# Patient Record
Sex: Female | Born: 1965 | Race: White | Hispanic: Yes | Marital: Married | Smoking: Current every day smoker
Health system: Southern US, Community
[De-identification: ages and names within clinical notes are randomized; demographics above are authoritative.]

## PROBLEM LIST (undated history)

## (undated) DIAGNOSIS — I639 Cerebral infarction, unspecified: Secondary | ICD-10-CM

## (undated) DIAGNOSIS — E119 Type 2 diabetes mellitus without complications: Secondary | ICD-10-CM

## (undated) DIAGNOSIS — E079 Disorder of thyroid, unspecified: Secondary | ICD-10-CM

## (undated) HISTORY — PX: CHOLECYSTECTOMY: SHX55

## (undated) HISTORY — DX: Cerebral infarction, unspecified: I63.9

## (undated) HISTORY — DX: Type 2 diabetes mellitus without complications: E11.9

## (undated) HISTORY — PX: COLONOSCOPY: SHX174

## (undated) HISTORY — PX: ABDOMINAL HYSTERECTOMY: SHX81

## (undated) HISTORY — PX: UPPER GASTROINTESTINAL ENDOSCOPY: SHX188

---

## 2001-08-01 ENCOUNTER — Other Ambulatory Visit: Admission: RE | Admit: 2001-08-01 | Discharge: 2001-08-01 | Payer: Self-pay | Admitting: Obstetrics and Gynecology

## 2001-08-05 ENCOUNTER — Ambulatory Visit (HOSPITAL_COMMUNITY): Admission: RE | Admit: 2001-08-05 | Discharge: 2001-08-05 | Payer: Self-pay | Admitting: Obstetrics and Gynecology

## 2001-08-05 ENCOUNTER — Encounter: Payer: Self-pay | Admitting: Obstetrics and Gynecology

## 2008-07-06 ENCOUNTER — Ambulatory Visit (HOSPITAL_COMMUNITY): Admission: RE | Admit: 2008-07-06 | Discharge: 2008-07-06 | Payer: Self-pay | Admitting: Internal Medicine

## 2014-02-14 ENCOUNTER — Emergency Department (HOSPITAL_COMMUNITY): Payer: Self-pay

## 2014-02-14 ENCOUNTER — Encounter (HOSPITAL_COMMUNITY): Payer: Self-pay | Admitting: Emergency Medicine

## 2014-02-14 ENCOUNTER — Emergency Department (HOSPITAL_COMMUNITY): Payer: No Typology Code available for payment source

## 2014-02-14 ENCOUNTER — Emergency Department (HOSPITAL_COMMUNITY)
Admission: EM | Admit: 2014-02-14 | Discharge: 2014-02-14 | Disposition: A | Discharge status: Home / Self Care | Payer: Self-pay | Attending: Emergency Medicine | Admitting: Emergency Medicine

## 2014-02-14 DIAGNOSIS — R109 Unspecified abdominal pain: Secondary | ICD-10-CM | POA: Insufficient documentation

## 2014-02-14 DIAGNOSIS — S79919A Unspecified injury of unspecified hip, initial encounter: Secondary | ICD-10-CM | POA: Insufficient documentation

## 2014-02-14 DIAGNOSIS — IMO0002 Reserved for concepts with insufficient information to code with codable children: Secondary | ICD-10-CM | POA: Insufficient documentation

## 2014-02-14 DIAGNOSIS — Y9389 Activity, other specified: Secondary | ICD-10-CM | POA: Insufficient documentation

## 2014-02-14 DIAGNOSIS — Y92410 Unspecified street and highway as the place of occurrence of the external cause: Secondary | ICD-10-CM | POA: Insufficient documentation

## 2014-02-14 DIAGNOSIS — S79929A Unspecified injury of unspecified thigh, initial encounter: Secondary | ICD-10-CM

## 2014-02-14 DIAGNOSIS — T148XXA Other injury of unspecified body region, initial encounter: Secondary | ICD-10-CM | POA: Insufficient documentation

## 2014-02-14 DIAGNOSIS — Z862 Personal history of diseases of the blood and blood-forming organs and certain disorders involving the immune mechanism: Secondary | ICD-10-CM | POA: Insufficient documentation

## 2014-02-14 DIAGNOSIS — S3981XA Other specified injuries of abdomen, initial encounter: Secondary | ICD-10-CM | POA: Insufficient documentation

## 2014-02-14 DIAGNOSIS — E669 Obesity, unspecified: Secondary | ICD-10-CM | POA: Insufficient documentation

## 2014-02-14 DIAGNOSIS — Z8639 Personal history of other endocrine, nutritional and metabolic disease: Secondary | ICD-10-CM | POA: Insufficient documentation

## 2014-02-14 HISTORY — DX: Disorder of thyroid, unspecified: E07.9

## 2014-02-14 LAB — COMPREHENSIVE METABOLIC PANEL
ALT: 23 U/L (ref 0–35)
AST: 21 U/L (ref 0–37)
Albumin: 3.7 g/dL (ref 3.5–5.2)
Alkaline Phosphatase: 100 U/L (ref 39–117)
BUN: 13 mg/dL (ref 6–23)
CALCIUM: 9.3 mg/dL (ref 8.4–10.5)
CO2: 24 mEq/L (ref 19–32)
CREATININE: 0.61 mg/dL (ref 0.50–1.10)
Chloride: 100 mEq/L (ref 96–112)
GFR calc non Af Amer: >90 mL/min (ref >90)
GLUCOSE: 106 mg/dL — AB (ref 70–99)
Potassium: 3.8 mEq/L (ref 3.7–5.3)
SODIUM: 139 meq/L (ref 137–147)
Total Bilirubin: 0.2 mg/dL — ABNORMAL LOW (ref 0.3–1.2)
Total Protein: 7.9 g/dL (ref 6.0–8.3)

## 2014-02-14 LAB — CBC
HEMATOCRIT: 36.8 % (ref 36.0–46.0)
HEMOGLOBIN: 12.5 g/dL (ref 12.0–15.0)
MCH: 27.7 pg (ref 26.0–34.0)
MCHC: 34 g/dL (ref 30.0–36.0)
MCV: 81.6 fL (ref 78.0–100.0)
Platelets: 337 10*3/uL (ref 150–400)
RBC: 4.51 MIL/uL (ref 3.87–5.11)
RDW: 13.4 % (ref 11.5–15.5)
WBC: 11.3 10*3/uL — ABNORMAL HIGH (ref 4.0–10.5)

## 2014-02-14 LAB — PROTIME-INR
INR: 0.91 (ref 0.00–1.49)
PROTHROMBIN TIME: 12.1 s (ref 11.6–15.2)

## 2014-02-14 LAB — SAMPLE TO BLOOD BANK

## 2014-02-14 MED ORDER — IOHEXOL 300 MG/ML  SOLN
100.0000 mL | Freq: Once | INTRAMUSCULAR | Status: AC | PRN
  Administered 2014-02-14: 100 mL via INTRAVENOUS

## 2014-02-14 MED ORDER — ONDANSETRON HCL 4 MG/2ML IJ SOLN
4.0000 mg | Freq: Once | INTRAMUSCULAR | Status: AC
  Administered 2014-02-14: 4 mg via INTRAVENOUS
  Filled 2014-02-14: qty 2

## 2014-02-14 MED ORDER — HYDROCODONE-ACETAMINOPHEN 5-325 MG PO TABS: 1.0000 | ORAL_TABLET | ORAL | Status: DC | PRN

## 2014-02-14 MED ORDER — HYDROMORPHONE HCL PF 1 MG/ML IJ SOLN
1.0000 mg | INTRAMUSCULAR | Status: DC | PRN
  Administered 2014-02-14: 1 mg via INTRAVENOUS
  Filled 2014-02-14: qty 1

## 2014-02-14 MED ORDER — NAPROXEN 500 MG PO TABS: 500.0000 mg | ORAL_TABLET | Freq: Two times a day (BID) | ORAL | Status: DC
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
## 2015-01-31 ENCOUNTER — Ambulatory Visit: Payer: Self-pay
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
## 2017-10-02 ENCOUNTER — Ambulatory Visit: Payer: Self-pay | Attending: Oncology

## 2017-10-02 VITALS — BP 126/72 | HR 74 | Temp 97.3°F | Resp 18 | Ht 63.0 in | Wt 198.0 lb

## 2017-10-02 DIAGNOSIS — Z0000 Encounter for general adult medical examination without abnormal findings: Secondary | ICD-10-CM

## 2017-10-02 NOTE — Progress Notes (Addendum)
Subjective:     Patient ID: Bethany Vance, female   DOB: 01/01/1966, 51 y.o.   MRN: [id number]  HPI   Review of Systems     Objective:   Physical Exam  Pulmonary/Chest: Right breast exhibits no inverted nipple, no mass, no nipple discharge, no skin change and no tenderness. Left breast exhibits no inverted nipple, no mass, no nipple discharge, no skin change and no tenderness. Breasts are symmetrical.  Genitourinary: No labial fusion. There is no rash, tenderness, lesion or injury on the right labia. There is no rash, tenderness, lesion or injury on the left labia. Right adnexum displays no mass, no tenderness and no fullness. Left adnexum displays no mass, no tenderness and no fullness. No erythema, tenderness or bleeding in the vagina. No foreign body in the vagina. No signs of injury around the vagina. No vaginal discharge found.  Genitourinary Comments: Hysterectomy        Assessment:     51 year old hispanic patient presents for [facility] clinic visit. Kristeen MansMaritza Afanador interprets exam.  Patient screened, and meets [facility] eligibility.  Patient does not have insurance, Medicare or Medicaid.  Handout given on Affordable Care Act. Instructed patient on breast self-exam using teach back method.  CBE unremarkable.  No mass or lump palpated.  Pelvic exam normal.  Patient reports hs had hysterectomybut still has ovaries.  [place]  She is having perimenopausal symptoms of hot flashes, vaginal dryness, and intermittent breast tenderness.  Discussed use of vaginal moisterizers, and lubricants.      Plan:     Sent for bilateral screening mammogram.

## 2017-12-25 ENCOUNTER — Ambulatory Visit
Admission: RE | Admit: 2017-12-25 | Discharge: 2017-12-25 | Disposition: A | Discharge status: Home / Self Care | Payer: Self-pay | Source: Ambulatory Visit | Attending: Oncology | Admitting: Oncology

## 2017-12-25 DIAGNOSIS — Z0000 Encounter for general adult medical examination without abnormal findings: Secondary | ICD-10-CM

## 2017-12-31 ENCOUNTER — Inpatient Hospital Stay
Admission: RE | Admit: 2017-12-31 | Discharge: 2017-12-31 | Disposition: A | Discharge status: Home / Self Care | Payer: Self-pay | Source: Ambulatory Visit | Attending: *Deleted | Admitting: *Deleted

## 2017-12-31 ENCOUNTER — Other Ambulatory Visit: Payer: Self-pay | Admitting: *Deleted

## 2017-12-31 DIAGNOSIS — Z9289 Personal history of other medical treatment: Secondary | ICD-10-CM

## 2018-01-23 NOTE — Progress Notes (Signed)
Radiologist had to wait for outside films.  [facility] results.  Patient to return in one year for annual screening.  Copy to [facility].

## 2022-11-08 ENCOUNTER — Encounter: Payer: Self-pay | Admitting: Critical Care Medicine

## 2022-11-08 ENCOUNTER — Other Ambulatory Visit: Payer: Self-pay

## 2022-11-08 ENCOUNTER — Ambulatory Visit: Payer: Self-pay | Attending: Critical Care Medicine | Admitting: Critical Care Medicine

## 2022-11-08 VITALS — BP 122/80 | HR 73 | Ht 64.0 in | Wt 209.8 lb

## 2022-11-08 DIAGNOSIS — E039 Hypothyroidism, unspecified: Secondary | ICD-10-CM

## 2022-11-08 DIAGNOSIS — E1165 Type 2 diabetes mellitus with hyperglycemia: Secondary | ICD-10-CM

## 2022-11-08 DIAGNOSIS — Z1159 Encounter for screening for other viral diseases: Secondary | ICD-10-CM

## 2022-11-08 DIAGNOSIS — Z114 Encounter for screening for human immunodeficiency virus [HIV]: Secondary | ICD-10-CM

## 2022-11-08 DIAGNOSIS — Z1231 Encounter for screening mammogram for malignant neoplasm of breast: Secondary | ICD-10-CM

## 2022-11-08 DIAGNOSIS — E119 Type 2 diabetes mellitus without complications: Secondary | ICD-10-CM | POA: Insufficient documentation

## 2022-11-08 MED ORDER — ATORVASTATIN CALCIUM 10 MG PO TABS: 10.0000 mg | ORAL_TABLET | Freq: Every day | ORAL | 3 refills | Status: DC

## 2022-11-08 MED ORDER — METFORMIN HCL ER 500 MG PO TB24: 500.0000 mg | ORAL_TABLET | Freq: Two times a day (BID) | ORAL | 1 refills | Status: DC

## 2022-11-08 MED ORDER — LEVOTHYROXINE SODIUM 100 MCG PO TABS: 100.0000 ug | ORAL_TABLET | Freq: Every day | ORAL | 1 refills | Status: DC

## 2022-11-08 MED ORDER — METFORMIN HCL ER 500 MG PO TB24
500.0000 mg | ORAL_TABLET | Freq: Two times a day (BID) | ORAL | 1 refills | Status: DC
  Filled 2022-11-08: qty 60, 30d supply, fill #0

## 2022-11-08 MED ORDER — LEVOTHYROXINE SODIUM 100 MCG PO TABS
100.0000 ug | ORAL_TABLET | Freq: Every day | ORAL | 1 refills | Status: DC
  Filled 2022-11-08: qty 90, 90d supply, fill #0

## 2022-11-08 MED ORDER — ATORVASTATIN CALCIUM 10 MG PO TABS
10.0000 mg | ORAL_TABLET | Freq: Every day | ORAL | 3 refills | Status: DC
  Filled 2022-11-08: qty 90, 90d supply, fill #0

## 2022-11-08 NOTE — Patient Instructions (Addendum)
  Mammogram will be ordered through scholarship program  You will return for a Pap smear by one of our providers  You need a dental exam that she get the orange card we will be able to get you in the dentist  Refills on all medications sent to our pharmacy downstairs and you will start a cholesterol pill as well 1 daily  Complete screening labs will be obtained  Return to Dr. Joya Gaskins 3 months  Se ordenar mamografa a travs de programa de becas  Usted regresar para una prueba de Papanicolaou realizada por uno de nuestros proveedores.  Necesitas un examen dental y ella obtendr la tarjeta naranja y podremos llevarte al dentista.  Los reabastecimientos de [employer] se envan a nuestra farmacia de abajo y [place] comenzar a tomar una pastilla para el colesterol, 1 al Training and development officer.  Se obtendrn laboratorios de deteccin completos.  Regreso al Dr. Joya Gaskins 3 meses

## 2022-11-08 NOTE — Assessment & Plan Note (Addendum)
Type 2 diabetes with hyperglycemia or reassess hemoglobin A1c and lab screenings including lipid panel and we will renew metformin 500 mg twice daily We will check lipid panel and assess lipids

## 2022-11-08 NOTE — Progress Notes (Signed)
New Patient Office Visit  Subjective    Patient ID: Bethany Vance, female    DOB: 1966/06/24  Age: 57 y.o. MRN: [id number]  CC:  Chief Complaint  Patient presents with   New Patient (Initial Visit)   Medication Refill    HPI Bethany Vance presents to establish care This visit was assisted by in person interpreter Graciela for Spanish The patient presents with prior history of type 2 diabetes and hypothyroidism.  Her daughter [name] is in the room as well.  They are trying to apply for the orange card.  She previously was seen at the [facility] clinic on [address] but has not been seen in 6 months.  She has a prior history of a CVA with headache presentation in 2009 currently is asymptomatic.  She is not taking any aspirin at this time.  She needs a mammogram and Pap smear.  She also has poor dental hygiene will need a dental exam when she gets the orange card.  Diabetes runs in her family.  She does smoke 3 cigarettes daily.  She was on metformin and Synthroid previously but does not check her blood sugar regularly.     Outpatient Encounter Medications as of 11/08/2022  Medication Sig   [DISCONTINUED] atorvastatin (LIPITOR) 10 MG tablet Take 1 tablet (10 mg total) by mouth daily.   [DISCONTINUED] levothyroxine (SYNTHROID) 100 MCG tablet Take 100 mcg by mouth daily.   [DISCONTINUED] metFORMIN (GLUCOPHAGE-XR) 500 MG 24 hr tablet Take 500 mg by mouth 2 (two) times daily.   atorvastatin (LIPITOR) 10 MG tablet Take 1 tablet (10 mg total) by mouth daily.   levothyroxine (SYNTHROID) 100 MCG tablet Take 1 tablet (100 mcg total) by mouth daily.   metFORMIN (GLUCOPHAGE-XR) 500 MG 24 hr tablet Take 1 tablet (500 mg total) by mouth 2 (two) times daily.   naproxen (NAPROSYN) 500 MG tablet Take 1 tablet (500 mg total) by mouth 2 (two) times daily. (Patient not taking: Reported on 11/08/2022)   [DISCONTINUED] HYDROcodone-acetaminophen (NORCO/VICODIN) 5-325 MG per tablet Take  1-2 tablets by mouth every 4 (four) hours as needed. (Patient not taking: Reported on 11/08/2022)   [DISCONTINUED] levothyroxine (SYNTHROID) 100 MCG tablet Take 1 tablet (100 mcg total) by mouth daily.   [DISCONTINUED] metFORMIN (GLUCOPHAGE-XR) 500 MG 24 hr tablet Take 1 tablet (500 mg total) by mouth 2 (two) times daily.   No facility-administered encounter medications on file as of 11/08/2022.    Past Medical History:  Diagnosis Date   Diabetes mellitus without complication (HCC)    Stroke (HCC)    Thyroid disease     Past Surgical History:  Procedure Laterality Date   ABDOMINAL HYSTERECTOMY     CESAREAN SECTION      Family History  Problem Relation Age of Onset   Hearing loss Mother    Diabetes Father    Diabetes Maternal Grandmother    Diabetes Maternal Grandfather    Diabetes Paternal Grandmother    Diabetes Paternal Grandfather     Social History   Socioeconomic History   Marital status: Married    Spouse name: Not on file   Number of children: Not on file   Years of education: Not on file   Highest education level: Not on file  Occupational History   Not on file  Tobacco Use   Smoking status: Every Day    Types: Cigarettes   Smokeless tobacco: Not on file   Tobacco comments:    Smokes  2 cigs a day   Vaping Use   Vaping Use: Never used  Substance and Sexual Activity   Alcohol use: No   Drug use: No   Sexual activity: Yes  Other Topics Concern   Not on file  Social History Narrative   Not on file   Social Determinants of Health   Financial Resource Strain: Not on file  Food Insecurity: Not on file  Transportation Needs: Not on file  Physical Activity: Not on file  Stress: Not on file  Social Connections: Not on file  Intimate Partner Violence: Not on file    Review of Systems  Constitutional:  Negative for chills, diaphoresis, fever, malaise/fatigue and weight loss.  HENT:  Negative for congestion, hearing loss, nosebleeds, sore throat and  tinnitus.   Eyes:  Negative for blurred vision, photophobia and redness.  Respiratory:  Negative for cough, hemoptysis, sputum production, shortness of breath, wheezing and stridor.   Cardiovascular:  Negative for chest pain, palpitations, orthopnea, claudication, leg swelling and PND.  Gastrointestinal:  Negative for abdominal pain, blood in stool, constipation, diarrhea, heartburn, nausea and vomiting.  Genitourinary:  Negative for dysuria, flank pain, frequency, hematuria and urgency.  Musculoskeletal:  Negative for back pain, falls, joint pain, myalgias and neck pain.  Skin:  Negative for itching and rash.  Neurological:  Negative for dizziness, tingling, tremors, sensory change, speech change, focal weakness, seizures, loss of consciousness, weakness and headaches.  Endo/Heme/Allergies:  Negative for environmental allergies and polydipsia. Does not bruise/bleed easily.  Psychiatric/Behavioral:  Negative for depression, memory loss, substance abuse and suicidal ideas. The patient is not nervous/anxious and does not have insomnia.         Objective    BP 122/80 (BP Location: Left Arm, Patient Position: Sitting, Cuff Size: Normal)   Pulse 73   Ht 5\' 4"  (1.626 m)   Wt 209 lb 12.8 oz (95.2 kg)   SpO2 99%   BMI 36.01 kg/m   Physical Exam Vitals reviewed.  Constitutional:      Appearance: Normal appearance. She is well-developed. She is obese. She is not diaphoretic.  HENT:     Head: Normocephalic and atraumatic.     Nose: No nasal deformity, septal deviation, mucosal edema or rhinorrhea.     Right Sinus: No maxillary sinus tenderness or frontal sinus tenderness.     Left Sinus: No maxillary sinus tenderness or frontal sinus tenderness.     Mouth/Throat:     Pharynx: No oropharyngeal exudate.     Comments: Poor dentition with molar decay in both upper lower third molars Eyes:     General: No scleral icterus.    Conjunctiva/sclera: Conjunctivae normal.     Pupils: Pupils are  equal, round, and reactive to light.  Neck:     Thyroid: No thyromegaly.     Vascular: No carotid bruit or JVD.     Trachea: Trachea normal. No tracheal tenderness or tracheal deviation.  Cardiovascular:     Rate and Rhythm: Normal rate and regular rhythm.     Chest Wall: PMI is not displaced.     Pulses: Normal pulses. No decreased pulses.     Heart sounds: Normal heart sounds, S1 normal and S2 normal. Heart sounds not distant. No murmur heard.    No systolic murmur is present.     No diastolic murmur is present.     No friction rub. No gallop. No S3 or S4 sounds.  Pulmonary:     Effort: No tachypnea, accessory muscle  usage or respiratory distress.     Breath sounds: No stridor. No decreased breath sounds, wheezing, rhonchi or rales.  Chest:     Chest wall: No tenderness.  Abdominal:     General: Bowel sounds are normal. There is no distension.     Palpations: Abdomen is soft. Abdomen is not rigid.     Tenderness: There is no abdominal tenderness. There is no guarding or rebound.  Musculoskeletal:        General: Normal range of motion.     Cervical back: Normal range of motion and neck supple. No edema, erythema or rigidity. No muscular tenderness. Normal range of motion.  Lymphadenopathy:     Head:     Right side of head: No submental or submandibular adenopathy.     Left side of head: No submental or submandibular adenopathy.     Cervical: No cervical adenopathy.  Skin:    General: Skin is warm and dry.     Coloration: Skin is not pale.     Findings: No rash.     Nails: There is no clubbing.  Neurological:     Mental Status: She is alert and oriented to person, place, and time.     Sensory: No sensory deficit.  Psychiatric:        Mood and Affect: Mood normal.        Speech: Speech normal.        Behavior: Behavior normal.        Thought Content: Thought content normal.        Judgment: Judgment normal.         Assessment & Plan:   Problem List Items Addressed  This Visit       Endocrine   Type 2 diabetes mellitus ([place]) - Primary    Type 2 diabetes with hyperglycemia or reassess hemoglobin A1c and lab screenings including lipid panel and we will renew metformin 500 mg twice daily We will check lipid panel and assess lipids      Relevant Medications   atorvastatin (LIPITOR) 10 MG tablet   metFORMIN (GLUCOPHAGE-XR) 500 MG 24 hr tablet   Other Relevant Orders   Lipid panel   Comprehensive metabolic panel   CBC with Differential/Platelet   Microalbumin / creatinine urine ratio   Hypothyroidism (acquired)    History of hypothyroidism will check thyroid panel and resume levothyroxine at 100 mg daily and follow-up      Relevant Medications   levothyroxine (SYNTHROID) 100 MCG tablet   Other Visit Diagnoses     Hypothyroidism, unspecified type       Relevant Medications   levothyroxine (SYNTHROID) 100 MCG tablet   Other Relevant Orders   Thyroid Panel With TSH   Need for hepatitis C screening test       Relevant Orders   HCV Ab w Reflex to Quant PCR   Encounter for screening for HIV       Relevant Orders   HIV Antibody (routine testing w rflx)   Encounter for screening mammogram for malignant neoplasm of breast       Relevant Orders   MM DIGITAL SCREENING BILATERAL      Will get the patient in for a mammogram and also a Pap smear Return in about 3 months (around 02/07/2023) for diabetes, thyroid.   Asencion Noble, MD

## 2022-11-08 NOTE — Assessment & Plan Note (Signed)
History of hypothyroidism will check thyroid panel and resume levothyroxine at 100 mg daily and follow-up

## 2022-11-09 ENCOUNTER — Telehealth: Payer: Self-pay

## 2022-11-09 LAB — COMPREHENSIVE METABOLIC PANEL
ALT: 17 IU/L (ref 0–32)
AST: 19 IU/L (ref 0–40)
Albumin/Globulin Ratio: 1.1 — ABNORMAL LOW (ref 1.2–2.2)
Albumin: 3.9 g/dL (ref 3.8–4.9)
Alkaline Phosphatase: 127 IU/L — ABNORMAL HIGH (ref 44–121)
BUN/Creatinine Ratio: 22 (ref 9–23)
BUN: 17 mg/dL (ref 6–24)
Bilirubin Total: <0.2 mg/dL (ref 0.0–1.2)
CO2: 23 mmol/L (ref 20–29)
Calcium: 9.4 mg/dL (ref 8.7–10.2)
Chloride: 101 mmol/L (ref 96–106)
Creatinine, Ser: 0.78 mg/dL (ref 0.57–1.00)
Globulin, Total: 3.7 g/dL (ref 1.5–4.5)
Glucose: 90 mg/dL (ref 70–99)
Potassium: 3.9 mmol/L (ref 3.5–5.2)
Sodium: 136 mmol/L (ref 134–144)
Total Protein: 7.6 g/dL (ref 6.0–8.5)
eGFR: 89 mL/min/{1.73_m2} (ref >59)

## 2022-11-09 LAB — LIPID PANEL
Chol/HDL Ratio: 5 ratio — ABNORMAL HIGH (ref 0.0–4.4)
Cholesterol, Total: 244 mg/dL — ABNORMAL HIGH (ref 100–199)
HDL: 49 mg/dL (ref >39)
LDL Chol Calc (NIH): 160 mg/dL — ABNORMAL HIGH (ref 0–99)
Triglycerides: 192 mg/dL — ABNORMAL HIGH (ref 0–149)
VLDL Cholesterol Cal: 35 mg/dL (ref 5–40)

## 2022-11-09 LAB — CBC WITH DIFFERENTIAL/PLATELET
Basophils Absolute: 0.1 10*3/uL (ref 0.0–0.2)
Basos: 1 %
EOS (ABSOLUTE): 0.2 10*3/uL (ref 0.0–0.4)
Eos: 2 %
Hematocrit: 36.6 % (ref 34.0–46.6)
Hemoglobin: 11.8 g/dL (ref 11.1–15.9)
Immature Grans (Abs): 0 10*3/uL (ref 0.0–0.1)
Immature Granulocytes: 0 %
Lymphocytes Absolute: 4.1 10*3/uL — ABNORMAL HIGH (ref 0.7–3.1)
Lymphs: 39 %
MCH: 24.7 pg — ABNORMAL LOW (ref 26.6–33.0)
MCHC: 32.2 g/dL (ref 31.5–35.7)
MCV: 77 fL — ABNORMAL LOW (ref 79–97)
Monocytes Absolute: 0.5 10*3/uL (ref 0.1–0.9)
Monocytes: 5 %
Neutrophils Absolute: 5.6 10*3/uL (ref 1.4–7.0)
Neutrophils: 53 %
Platelets: 382 10*3/uL (ref 150–450)
RBC: 4.77 x10E6/uL (ref 3.77–5.28)
RDW: 14.2 % (ref 11.7–15.4)
WBC: 10.5 10*3/uL (ref 3.4–10.8)

## 2022-11-09 LAB — MICROALBUMIN / CREATININE URINE RATIO
Creatinine, Urine: 388.9 mg/dL
Microalb/Creat Ratio: 8 mg/g creat (ref 0–29)
Microalbumin, Urine: 31.9 ug/mL

## 2022-11-09 LAB — THYROID PANEL WITH TSH
Free Thyroxine Index: 0.7 — ABNORMAL LOW (ref 1.2–4.9)
T3 Uptake Ratio: 15 % — ABNORMAL LOW (ref 24–39)
T4, Total: 4.8 ug/dL (ref 4.5–12.0)
TSH: 52.7 u[IU]/mL — ABNORMAL HIGH (ref 0.450–4.500)

## 2022-11-09 LAB — HIV ANTIBODY (ROUTINE TESTING W REFLEX): HIV Screen 4th Generation wRfx: NONREACTIVE

## 2022-11-09 LAB — HCV INTERPRETATION

## 2022-11-09 LAB — HCV AB W REFLEX TO QUANT PCR: HCV Ab: NONREACTIVE

## 2022-11-09 NOTE — Telephone Encounter (Signed)
Pt was called and is aware of results, DOB was confirmed.  Interpreter id #[id number] 

## 2022-11-09 NOTE — Telephone Encounter (Signed)
-----  Message from Charlott Rakes, MD sent at 11/09/2022  8:32 AM EST ----- Inform her that her cholesterol is elevated and thyroid labs are abnormal which is not surprising as she had been out of her medications.  No changes recommended at this time but to ensure that she adheres to her medications consistently and follow-up with Dr. Joya Gaskins for repeat labs at her next visit as well as continue with a low-cholesterol diet.

## 2022-12-03 ENCOUNTER — Encounter: Payer: Self-pay | Admitting: Physician Assistant

## 2022-12-03 ENCOUNTER — Other Ambulatory Visit (HOSPITAL_COMMUNITY)
Admission: RE | Admit: 2022-12-03 | Discharge: 2022-12-03 | Disposition: A | Discharge status: Home / Self Care | Payer: Self-pay | Source: Ambulatory Visit | Attending: Physician Assistant | Admitting: Physician Assistant

## 2022-12-03 ENCOUNTER — Ambulatory Visit: Payer: Self-pay | Attending: Physician Assistant | Admitting: Physician Assistant

## 2022-12-03 ENCOUNTER — Other Ambulatory Visit: Payer: Self-pay

## 2022-12-03 VITALS — BP 109/75 | HR 76 | Wt 208.0 lb

## 2022-12-03 DIAGNOSIS — Z124 Encounter for screening for malignant neoplasm of cervix: Secondary | ICD-10-CM | POA: Insufficient documentation

## 2022-12-03 DIAGNOSIS — N9089 Other specified noninflammatory disorders of vulva and perineum: Secondary | ICD-10-CM

## 2022-12-03 DIAGNOSIS — N941 Unspecified dyspareunia: Secondary | ICD-10-CM | POA: Insufficient documentation

## 2022-12-03 DIAGNOSIS — Z789 Other specified health status: Secondary | ICD-10-CM

## 2022-12-03 DIAGNOSIS — N898 Other specified noninflammatory disorders of vagina: Secondary | ICD-10-CM | POA: Insufficient documentation

## 2022-12-03 MED ORDER — MUPIROCIN 2 % EX OINT
1.0000 | TOPICAL_OINTMENT | Freq: Two times a day (BID) | CUTANEOUS | 0 refills | Status: DC
  Filled 2022-12-03: qty 22, 11d supply, fill #0

## 2022-12-03 NOTE — Patient Instructions (Signed)
Astroglide-purple vial   Vaginitis atrfica Atrophic Vaginitis  La vaginitis atrfica se produce cuando el recubrimiento de la vagina se seca y afina. Esto es ms frecuente en las mujeres que ya no tienen el perodo menstrual (que se encuentran en la menopausia). Suele comenzar cuando la mujer tiene entre 33 y 3 aos. Cules son las causas? La causa de esta afeccin es una cada en la produccin de una hormona femenina (estrgeno) causa esta afeccin. Qu incrementa el riesgo? Es ms probable que sufra esta afeccin si: Toma ciertos medicamentos. Le han extirpado los ovarios. Recibe tratamiento para el cncer. Ha dado a luz o est amamantando. Tiene ms de 50 aos. Fuma. Cules son los signos o sntomas? Dolor al [employer]. Sensacin de presin [employer]. Sangrado [employer]. Ardor o picazn adentro de la vagina. Dolor urente al hacer pis (orinar). Secrecin de un lquido de la vagina. Algunas personas no tienen sntomas. Cmo se trata? Uso de un lubricante antes de las [employer]. Uso de un hidratante en la vagina. Uso de estrgenos en la vagina. En algunos casos, puede ser que no se necesite tratamiento. Siga estas instrucciones en su casa: Medicamentos Use todos los medicamentos solamente como se lo haya indicado el mdico. Esto incluye medicamentos para la sequedad. No use ningn medicamento a base de hierbas, excepto que el mdico lo apruebe. Instrucciones generales Hable con su mdico acerca del tratamiento. No se haga duchas vaginales. No utilice los siguientes productos con fragancia: Aerosoles. Tampones. Jabones. Si siente dolor al [employer], pruebe usar lubricantes justo antes. Comunquese con un mdico si: Le sale un lquido que no es como el normal por la vagina. Le sale mal olor de la vagina. Aparecen nuevos sntomas. Los sntomas no mejoran cuando se los trata. Sus sntomas empeoran. Resumen Esta  afeccin se produce cuando el recubrimiento de la vagina se seca y afina. Es ms frecuente en mujeres que ya no tienen el perodo menstrual. El tratamiento puede incluir el uso de medicamentos para la sequedad. Llame a un mdico si sus sntomas no mejoran. Esta informacin no tiene Marine scientist el consejo del mdico. Asegrese de hacerle al mdico cualquier pregunta que tenga. Document Revised: 05/18/2020 Document Reviewed: 05/18/2020 Elsevier Patient Education  [place].

## 2022-12-03 NOTE — Progress Notes (Signed)
Patient ID: Bethany Vance, female   DOB: Dec 03, 1965, 57 y.o.   MRN: [id number]   Bethany Vance, is a 57 y.o. female  SWN:[id number]  [id number]:[id number]  DOB - 03-17-1966  Chief Complaint  Patient presents with   Gynecologic Exam    Annual pap  Pt c/o vaginal odor, and pimples        Subjective:   Bethany Vance is a 57 y.o. female here today for pap.  She has been having tender "bumps" on and off for about 6 months.  No fever.  Not in clusters.  She has also been having intermittent vaginal discharge.  Having vaginal dryness and discomfort with sex.  No pelvic pain No problems updated.  ALLERGIES: No Known Allergies  PAST MEDICAL HISTORY: Past Medical History:  Diagnosis Date   Diabetes mellitus without complication ([place])    Stroke ([place])    Thyroid disease     MEDICATIONS AT HOME: Prior to Admission medications   Medication Sig Start Date End Date Taking? Authorizing Provider  atorvastatin (LIPITOR) 10 MG tablet Take 1 tablet (10 mg total) by mouth daily. 11/08/22  Yes Elsie Stain, MD  levothyroxine (SYNTHROID) 100 MCG tablet Take 1 tablet (100 mcg total) by mouth daily. 11/08/22  Yes Elsie Stain, MD  metFORMIN (GLUCOPHAGE-XR) 500 MG 24 hr tablet Take 1 tablet (500 mg total) by mouth 2 (two) times daily. 11/08/22  Yes Elsie Stain, MD  mupirocin ointment (BACTROBAN) 2 % Apply 1 Application topically 2 (two) times daily. 12/03/22  Yes Freeman Caldron M, PA-C  naproxen (NAPROSYN) 500 MG tablet Take 1 tablet (500 mg total) by mouth 2 (two) times daily. Patient not taking: Reported on 11/08/2022 02/14/14   Dorie Rank, MD    ROS: Neg HEENT Neg resp Neg cardiac Neg GI Neg MS Neg psych Neg neuro  Objective:   Vitals:   12/03/22 1116 12/03/22 1119  BP: 109/75 109/75  Pulse: 76 76  SpO2: 98%   Weight: 208 lb (94.3 kg)    Exam General appearance : Awake, alert, not in any distress. Speech Clear. Not toxic looking HEENT: Atraumatic and  Normocephalic Neck: Supple, no JVD. No cervical lymphadenopathy.  Chest: Good air entry bilaterally, CTAB.  No rales/rhonchi/wheezing CVS: S1 S2 regular, no murmurs.  GU:  there are a couple bumps on the pubic area that appear like folliculitis.  HSV swab taken.  Speculum inserted-some vaginal dryness and atrophy with some discomfort.  Pap and swab taken with partial view of cervix.  Bimanual unremarkable Extremities: B/L Lower Ext shows no edema, both legs are warm to touch Neurology: Awake alert, and oriented X 3, CN II-XII intact, Non focal Skin: No Rash  Data Review No results found for: "HGBA1C"  Assessment & Plan   1. Lesion of vulva Does not look typical for HSV but possible - Herpes simplex virus culture - mupirocin ointment (BACTROBAN) 2 %; Apply 1 Application topically 2 (two) times daily.  Dispense: 22 g; Refill: 0  2. Screening for cervical cancer - Cytology - PAP([place])  3. Dyspareunia in female Likely secondary to menopause/vaginal dryness.  Advised astroglide in purple vial and showed pic online - Cervicovaginal ancillary only  4. Vaginal discharge - Cervicovaginal ancillary only  5. Language barrier [employer] "Barry Brunner" interpreters used and additional time performing visit was required.     Return for aapt in april with Dr Joya Gaskins.  The patient was given clear instructions to go to ER or return to medical  center if symptoms don't improve, worsen or new problems develop. The patient verbalized understanding. The patient was told to call to get lab results if they haven't heard anything in the next week.      Freeman Caldron, PA-C [facility] and [facility] [place], [place]   12/03/2022, 12:09 PM

## 2022-12-04 LAB — CERVICOVAGINAL ANCILLARY ONLY
Bacterial Vaginitis (gardnerella): POSITIVE — AB
Candida Glabrata: NEGATIVE
Candida Vaginitis: NEGATIVE
Chlamydia: NEGATIVE
Comment: NEGATIVE
Comment: NEGATIVE
Comment: NEGATIVE
Comment: NEGATIVE
Comment: NEGATIVE
Comment: NORMAL
Neisseria Gonorrhea: NEGATIVE
Trichomonas: NEGATIVE

## 2022-12-05 ENCOUNTER — Other Ambulatory Visit: Payer: Self-pay | Admitting: Physician Assistant

## 2022-12-05 ENCOUNTER — Other Ambulatory Visit: Payer: Self-pay

## 2022-12-05 LAB — HERPES SIMPLEX VIRUS CULTURE

## 2022-12-05 MED ORDER — METRONIDAZOLE 500 MG PO TABS
500.0000 mg | ORAL_TABLET | Freq: Two times a day (BID) | ORAL | 0 refills | Status: DC
  Filled 2022-12-05: qty 14, 7d supply, fill #0

## 2022-12-06 ENCOUNTER — Other Ambulatory Visit: Payer: Self-pay

## 2022-12-06 ENCOUNTER — Ambulatory Visit: Payer: Self-pay | Attending: Critical Care Medicine

## 2022-12-06 ENCOUNTER — Telehealth: Payer: Self-pay | Admitting: Critical Care Medicine

## 2022-12-06 LAB — CYTOLOGY - PAP
Adequacy: ABSENT
Comment: NEGATIVE
Diagnosis: NEGATIVE
High risk HPV: NEGATIVE

## 2022-12-06 NOTE — Telephone Encounter (Signed)
Pt will visit tomorrow (Fri 12/07/22) a/f 9:30 AM to pick up [facility].

## 2022-12-18 ENCOUNTER — Telehealth: Payer: Self-pay

## 2022-12-18 NOTE — Telephone Encounter (Signed)
Called patient twice unable to make contact or leave vm. Information sent to nurse pool.  Interpreter id [id number]

## 2022-12-18 NOTE — Telephone Encounter (Signed)
-----   Message from Carilyn Goodpasture, RN sent at 12/07/2022  3:08 PM EST -----  ----- Message ----- From: Argentina Donovan, PA-C Sent: 12/05/2022   1:04 PM EST To: Carilyn Goodpasture, RN  Please call patient.  Vaginal swabs showed imbalance of vaginal bacteria(bacterial vaginosis)-I sent her a prescription for this.  No yeast, no chlamydia, gonorrhea, trichomonas.  Her pap smear is not back yet.  Thanks, Freeman Caldron, PA-C

## 2022-12-20 ENCOUNTER — Ambulatory Visit
Admission: RE | Admit: 2022-12-20 | Discharge: 2022-12-20 | Disposition: A | Discharge status: Home / Self Care | Payer: No Typology Code available for payment source | Source: Ambulatory Visit | Attending: Critical Care Medicine | Admitting: Critical Care Medicine

## 2022-12-20 DIAGNOSIS — Z1231 Encounter for screening mammogram for malignant neoplasm of breast: Secondary | ICD-10-CM

## 2022-12-25 NOTE — Progress Notes (Signed)
Let pt know mammogram normal recheck one year

## 2022-12-28 ENCOUNTER — Telehealth: Payer: Self-pay

## 2022-12-28 NOTE — Telephone Encounter (Signed)
-----   Message from Elsie Stain, MD sent at 12/25/2022  8:16 AM EST ----- Let pt know mammogram normal recheck one year

## 2022-12-28 NOTE — Telephone Encounter (Signed)
Called patient unable to make contact or leave voicemail, information sent to nurse pool   Interpreter [phone]

## 2023-01-03 ENCOUNTER — Ambulatory Visit: Payer: Self-pay | Admitting: *Deleted

## 2023-01-03 NOTE — Telephone Encounter (Signed)
  Chief Complaint: Vomiting Symptoms: Intermittent Vomiting, states once a day for 2-3 days, then nothing 2-3 days. States only one episode at a time "At night when I cough phlegm up, yellowish." States burning in stomach when occurs. No appetite. States last BM 2-3 days ago, vomiting started 1 month ago. Frequency: 1 month Pertinent Negatives: Patient denies dizziness, fever, headache Disposition: '[]'$ ED /'[]'$ Urgent Care (no appt availability in office) / '[]'$ Appointment(In office/virtual)/ '[]'$  [place] [place] [facility]/ '[]'$ Home Care/ '[]'$ Refused Recommended Disposition /'[]'$ [place] [facility]/ '[x]'$  Follow-up with PCP Additional Notes: No availabiity, pt has appt 02/07/23, placed on wait list. Can pt be seen sooner?  Care advise provided, verbalizes understanding.   Reason for Disposition  [1] MILD vomiting with diarrhea AND [2] present > 5 days  Answer Assessment - Initial Assessment Questions 1. VOMITING SEVERITY: "How many times have you vomited in the past 24 hours?"     - MILD:  1 - 2 times/day    - MODERATE: 3 - 5 times/day, decreased oral intake without significant weight loss or symptoms of dehydration    - SEVERE: 6 or more times/day, vomits everything or nearly everything, with significant weight loss, symptoms of dehydration      Once a day. 2. ONSET: "When did the vomiting begin?"      1 month, 2-3 days vomiting, 2-3 days not vomiting 3. FLUIDS: "What fluids or food have you vomited up today?" "Have you been able to keep any fluids down?"     Able to drink 4. ABDOMEN PAIN: "Are your having any abdomen pain?" If Yes : "How bad is it and what does it feel like?" (e.g., crampy, dull, intermittent, constant)      Heartburn 5. DIARRHEA: "Is there any diarrhea?" If Yes, ask: "How many times today?"      Constipated, LBM  2-3 days ago.  6. CONTACTS: "Is there anyone else in the family with the same symptoms?"       7. CAUSE: "What do you think is causing your vomiting?"     Phlegm,  yellowish. 8. HYDRATION STATUS: "Any signs of dehydration?" (e.g., dry mouth [not only dry lips], too weak to stand) "When did you last urinate?"     No 9. OTHER SYMPTOMS: "Do you have any other symptoms?" (e.g., fever, headache, vertigo, vomiting blood or coffee grounds, recent head injury)     no  Protocols used: Vomiting-A-AH

## 2023-01-03 NOTE — Telephone Encounter (Signed)
Interpreter # [phone]-Patient c/o coughing up phlegm for several days. Voices that she has some SOB only  when she is walking. Patient refuse UC of ED appointment given for 01/07/2023 at [facility]

## 2023-01-07 ENCOUNTER — Other Ambulatory Visit: Payer: Self-pay

## 2023-01-07 ENCOUNTER — Ambulatory Visit (INDEPENDENT_AMBULATORY_CARE_PROVIDER_SITE_OTHER): Payer: Self-pay | Admitting: Primary Care

## 2023-01-07 ENCOUNTER — Encounter (INDEPENDENT_AMBULATORY_CARE_PROVIDER_SITE_OTHER): Payer: Self-pay | Admitting: Primary Care

## 2023-01-07 VITALS — BP 121/86 | HR 79 | Resp 16 | Wt 206.2 lb

## 2023-01-07 DIAGNOSIS — R051 Acute cough: Secondary | ICD-10-CM

## 2023-01-07 DIAGNOSIS — F4321 Adjustment disorder with depressed mood: Secondary | ICD-10-CM

## 2023-01-07 DIAGNOSIS — T7840XA Allergy, unspecified, initial encounter: Secondary | ICD-10-CM

## 2023-01-07 MED ORDER — LORATADINE 10 MG PO TABS
10.0000 mg | ORAL_TABLET | Freq: Every day | ORAL | 1 refills | Status: DC
  Filled 2023-01-07: qty 90, 90d supply, fill #0

## 2023-01-07 NOTE — Progress Notes (Signed)
[place], is a 57 y.o. female  [id number]  [id number]  DOB - 1966-07-25  Chief Complaint  Patient presents with   Cough       Subjective:   Bethany Vance is a 57 y.o. Hispanic female (interpreter [place] [phone])   here today for an acute visit for cough. She has c/o vomiting everyday for the last month and coughing 1 month.She has abdominal pain and  Nausea. Patient has No headache, No chest pain, No new weakness tingling or numbness, No Cough - shortness of breath  No problems updated.  No Known Allergies  Past Medical History:  Diagnosis Date   Diabetes mellitus without complication ([place])    Stroke [facility])    Thyroid disease     Current Outpatient Medications on File Prior to Visit  Medication Sig Dispense Refill   metroNIDAZOLE (FLAGYL) 500 MG tablet Take 1 tablet (500 mg total) by mouth 2 (two) times daily. 14 tablet 0   atorvastatin (LIPITOR) 10 MG tablet Take 1 tablet (10 mg total) by mouth daily. 90 tablet 3   levothyroxine (SYNTHROID) 100 MCG tablet Take 1 tablet (100 mcg total) by mouth daily. 90 tablet 1   metFORMIN (GLUCOPHAGE-XR) 500 MG 24 hr tablet Take 1 tablet (500 mg total) by mouth 2 (two) times daily. 180 tablet 1   mupirocin ointment (BACTROBAN) 2 % Apply 1 Application topically 2 (two) times daily. 22 g 0   naproxen (NAPROSYN) 500 MG tablet Take 1 tablet (500 mg total) by mouth 2 (two) times daily. (Patient not taking: Reported on 11/08/2022) 30 tablet 0   No current facility-administered medications on file prior to visit.    Objective:   Vitals:   01/07/23 1404  BP: 121/86  Pulse: 79  Resp: 16  SpO2: 99%  Weight: 206 lb 3.2 oz (93.5 kg)    Comprehensive ROS Pertinent positive and negative noted in HPI   Exam General appearance : Awake, alert, not in any distress. Speech Clear. Not toxic looking HEENT: Atraumatic and Normocephalic, pupils equally reactive to light and accomodation, nares  bilateral red boggy  Neck: Supple, no JVD. No cervical lymphadenopathy.  Chest: Good air entry bilaterally, no added sounds  CVS: S1 S2 regular, no murmurs.  Abdomen: Bowel sounds present, Non tender and not distended with no gaurding, rigidity or rebound. Extremities: B/L Lower Ext shows no edema, both legs are warm to touch Neurology: Awake alert, and oriented X 3, Non focal Skin: No Rash  Data Review No results found for: "HGBA1C"  Assessment & Plan  Bethany Vance was seen today for cough.  Diagnoses and all orders for this visit: [name] was seen today for cough.  Diagnoses and all orders for this visit:   Allergy, initial encounter   2/2 Acute cough -     loratadine (CLARITIN) 10 MG tablet; Take 1 tablet (10 mg total) by mouth daily.  Grieving  Loss mother a couple of months ago started cry normal grieving process  LCSS       Patient have been counseled extensively about nutrition and exercise. Other issues discussed during this visit include: low cholesterol diet, weight control and daily exercise, foot care, annual eye examinations at Ophthalmology, importance of adherence with medications and regular follow-up. We also discussed long term complications of uncontrolled diabetes and hypertension.   No follow-ups on file.  The patient was given clear instructions to go to ER or return to medical center if symptoms don't improve, worsen  or new problems develop. The patient verbalized understanding. The patient was told to call to get lab results if they haven't heard anything in the next week.   This note has been created with Surveyor, quantity. Any transcriptional errors are unintentional.   Kerin Perna, NP 01/07/2023, 2:07 PM

## 2023-01-22 ENCOUNTER — Ambulatory Visit: Payer: Self-pay | Admitting: Internal Medicine

## 2023-01-22 ENCOUNTER — Telehealth (INDEPENDENT_AMBULATORY_CARE_PROVIDER_SITE_OTHER): Payer: Self-pay | Admitting: Licensed Clinical Social Worker

## 2023-01-22 NOTE — Telephone Encounter (Signed)
Interpreter [phone] --LCSWA called patient today to introduce herself and to assess patients' mental health needs. Patient did not answer the phone. LCSWA was not able to leave a message. Patient was referred by PCP for grieving.

## 2023-02-07 ENCOUNTER — Other Ambulatory Visit: Payer: Self-pay

## 2023-02-07 ENCOUNTER — Encounter: Payer: Self-pay | Admitting: Critical Care Medicine

## 2023-02-07 ENCOUNTER — Ambulatory Visit: Payer: Self-pay | Attending: Critical Care Medicine | Admitting: Critical Care Medicine

## 2023-02-07 VITALS — BP 106/72 | HR 73 | Ht 64.0 in | Wt 205.8 lb

## 2023-02-07 DIAGNOSIS — N9089 Other specified noninflammatory disorders of vulva and perineum: Secondary | ICD-10-CM | POA: Insufficient documentation

## 2023-02-07 DIAGNOSIS — E039 Hypothyroidism, unspecified: Secondary | ICD-10-CM

## 2023-02-07 DIAGNOSIS — E1165 Type 2 diabetes mellitus with hyperglycemia: Secondary | ICD-10-CM

## 2023-02-07 DIAGNOSIS — K219 Gastro-esophageal reflux disease without esophagitis: Secondary | ICD-10-CM

## 2023-02-07 DIAGNOSIS — E1169 Type 2 diabetes mellitus with other specified complication: Secondary | ICD-10-CM

## 2023-02-07 DIAGNOSIS — R1313 Dysphagia, pharyngeal phase: Secondary | ICD-10-CM

## 2023-02-07 DIAGNOSIS — E785 Hyperlipidemia, unspecified: Secondary | ICD-10-CM

## 2023-02-07 HISTORY — DX: Type 2 diabetes mellitus with other specified complication: E11.69

## 2023-02-07 LAB — POCT GLYCOSYLATED HEMOGLOBIN (HGB A1C): HbA1c, POC (controlled diabetic range): 6.1 % (ref 0.0–7.0)

## 2023-02-07 MED ORDER — ONDANSETRON HCL 4 MG PO TABS
4.0000 mg | ORAL_TABLET | Freq: Three times a day (TID) | ORAL | 0 refills | Status: DC | PRN
  Filled 2023-02-07: qty 20, 7d supply, fill #0

## 2023-02-07 MED ORDER — PANTOPRAZOLE SODIUM 40 MG PO TBEC
40.0000 mg | DELAYED_RELEASE_TABLET | Freq: Every day | ORAL | 3 refills | Status: DC
  Filled 2023-02-07: qty 30, 30d supply, fill #0

## 2023-02-07 NOTE — Progress Notes (Signed)
Patient states that she has been vomiting phlegm and bile for 3 months.  It happens before and after eating. Only the liquid is regurgitated after eating.  Peto was taken without relief.

## 2023-02-07 NOTE — Assessment & Plan Note (Signed)
Referral to gynecology

## 2023-02-07 NOTE — Progress Notes (Signed)
New Patient Office Visit  Subjective    Patient ID: Bethany Vance, female    DOB: 10/12/1966  Age: 57 y.o. MRN: [id number]  CC:  Chief Complaint  Patient presents with   Diabetes    HPI 11/08/22 Bethany Vance presents to establish care This visit was assisted by in person interpreter Graciela for Spanish The patient presents with prior history of type 2 diabetes and hypothyroidism.  Her daughter Aram BeechamCynthia is in the room as well.  They are trying to apply for the orange card.  She previously was seen at the [place] clinic on [address] but has not been seen in 6 months.  She has a prior history of a CVA with headache presentation in 2009 currently is asymptomatic.  She is not taking any aspirin at this time.  She needs a mammogram and Pap smear.  She also has poor dental hygiene will need a dental exam when she gets the orange card.  Diabetes runs in her family.  She does smoke 3 cigarettes daily.  She was on metformin and Synthroid previously but does not check her blood sugar regularly.   02/07/23 Patient seen for return follow-up visit assisted by 1 video interpreter for Spanish [phone].  The patient has multiple complaints.  She has had nausea and emesis for 3 months.  This appeared to occur when metformin was started and she experienced onset of severe reflux symptoms.  She had hypothyroidism we started her on levothyroxine this needs to be rechecked.  On arrival A1c is 6.1 and blood pressure is 106/72.  She also has a vulvar lesion which needs to be followed up by gynecology.  She does now have the orange card.  Outpatient Encounter Medications as of 02/07/2023  Medication Sig   atorvastatin (LIPITOR) 10 MG tablet Take 1 tablet (10 mg total) by mouth daily.   levothyroxine (SYNTHROID) 100 MCG tablet Take 1 tablet (100 mcg total) by mouth daily.   ondansetron (ZOFRAN) 4 MG tablet Take 1 tablet (4 mg total) by mouth every 8 (eight) hours as needed for nausea or  vomiting.   pantoprazole (PROTONIX) 40 MG tablet Take 1 tablet (40 mg total) by mouth daily.   [DISCONTINUED] loratadine (CLARITIN) 10 MG tablet Take 1 tablet (10 mg total) by mouth daily.   [DISCONTINUED] metFORMIN (GLUCOPHAGE-XR) 500 MG 24 hr tablet Take 1 tablet (500 mg total) by mouth 2 (two) times daily.   [DISCONTINUED] metroNIDAZOLE (FLAGYL) 500 MG tablet Take 1 tablet (500 mg total) by mouth 2 (two) times daily.   [DISCONTINUED] mupirocin ointment (BACTROBAN) 2 % Apply 1 Application topically 2 (two) times daily.   [DISCONTINUED] naproxen (NAPROSYN) 500 MG tablet Take 1 tablet (500 mg total) by mouth 2 (two) times daily.   No facility-administered encounter medications on file as of 02/07/2023.    Past Medical History:  Diagnosis Date   Diabetes mellitus without complication    Hyperlipidemia associated with type 2 diabetes mellitus 02/07/2023   Stroke    Thyroid disease     Past Surgical History:  Procedure Laterality Date   ABDOMINAL HYSTERECTOMY     CESAREAN SECTION      Family History  Problem Relation Age of Onset   Hearing loss Mother    Diabetes Father    Diabetes Maternal Grandmother    Diabetes Maternal Grandfather    Diabetes Paternal Grandmother    Diabetes Paternal Grandfather     Social History   Socioeconomic History  Marital status: Married    Spouse name: Not on file   Number of children: Not on file   Years of education: Not on file   Highest education level: Not on file  Occupational History   Not on file  Tobacco Use   Smoking status: Every Day    Years: 20    Types: Cigarettes   Smokeless tobacco: Never   Tobacco comments:    Smokes 2 cigs a day   Vaping Use   Vaping Use: Never used  Substance and Sexual Activity   Alcohol use: No   Drug use: No   Sexual activity: Yes  Other Topics Concern   Not on file  Social History Narrative   Not on file   Social Determinants of Health   Financial Resource Strain: Not on file  Food  Insecurity: Not on file  Transportation Needs: Not on file  Physical Activity: Not on file  Stress: Not on file  Social Connections: Not on file  Intimate Partner Violence: Not on file    Review of Systems  Constitutional:  Negative for chills, diaphoresis, fever, malaise/fatigue and weight loss.  HENT:  Negative for congestion, hearing loss, nosebleeds, sore throat and tinnitus.   Eyes:  Negative for blurred vision, photophobia and redness.  Respiratory:  Negative for cough, hemoptysis, sputum production, shortness of breath, wheezing and stridor.   Cardiovascular:  Negative for chest pain, palpitations, orthopnea, claudication, leg swelling and PND.  Gastrointestinal:  Positive for nausea and vomiting. Negative for abdominal pain, blood in stool, constipation, diarrhea and heartburn.  Genitourinary:  Negative for dysuria, flank pain, frequency, hematuria and urgency.  Musculoskeletal:  Negative for back pain, falls, joint pain, myalgias and neck pain.  Skin:  Negative for itching and rash.  Neurological:  Negative for dizziness, tingling, tremors, sensory change, speech change, focal weakness, seizures, loss of consciousness, weakness and headaches.  Endo/Heme/Allergies:  Negative for environmental allergies and polydipsia. Does not bruise/bleed easily.  Psychiatric/Behavioral:  Negative for depression, memory loss, substance abuse and suicidal ideas. The patient is not nervous/anxious and does not have insomnia.         Objective    BP 106/72   Pulse 73   Ht 5\' 4"  (1.626 m)   Wt 205 lb 12.8 oz (93.4 kg)   SpO2 98%   BMI 35.33 kg/m   Physical Exam Vitals reviewed.  Constitutional:      Appearance: Normal appearance. She is well-developed. She is obese. She is not diaphoretic.  HENT:     Head: Normocephalic and atraumatic.     Nose: No nasal deformity, septal deviation, mucosal edema or rhinorrhea.     Right Sinus: No maxillary sinus tenderness or frontal sinus tenderness.      Left Sinus: No maxillary sinus tenderness or frontal sinus tenderness.     Mouth/Throat:     Pharynx: No oropharyngeal exudate.     Comments: Poor dentition with molar decay in both upper lower third molars Eyes:     General: No scleral icterus.    Conjunctiva/sclera: Conjunctivae normal.     Pupils: Pupils are equal, round, and reactive to light.  Neck:     Thyroid: No thyromegaly.     Vascular: No carotid bruit or JVD.     Trachea: Trachea normal. No tracheal tenderness or tracheal deviation.  Cardiovascular:     Rate and Rhythm: Normal rate and regular rhythm.     Chest Wall: PMI is not displaced.     Pulses:  Normal pulses. No decreased pulses.     Heart sounds: Normal heart sounds, S1 normal and S2 normal. Heart sounds not distant. No murmur heard.    No systolic murmur is present.     No diastolic murmur is present.     No friction rub. No gallop. No S3 or S4 sounds.  Pulmonary:     Effort: No tachypnea, accessory muscle usage or respiratory distress.     Breath sounds: No stridor. No decreased breath sounds, wheezing, rhonchi or rales.  Chest:     Chest wall: No tenderness.  Abdominal:     General: Bowel sounds are normal. There is no distension.     Palpations: Abdomen is soft. Abdomen is not rigid.     Tenderness: There is no abdominal tenderness. There is no guarding or rebound.  Musculoskeletal:        General: Normal range of motion.     Cervical back: Normal range of motion and neck supple. No edema, erythema or rigidity. No muscular tenderness. Normal range of motion.  Lymphadenopathy:     Head:     Right side of head: No submental or submandibular adenopathy.     Left side of head: No submental or submandibular adenopathy.     Cervical: No cervical adenopathy.  Skin:    General: Skin is warm and dry.     Coloration: Skin is not pale.     Findings: No rash.     Nails: There is no clubbing.  Neurological:     Mental Status: She is alert and oriented to  person, place, and time.     Sensory: No sensory deficit.  Psychiatric:        Mood and Affect: Mood normal.        Speech: Speech normal.        Behavior: Behavior normal.        Thought Content: Thought content normal.        Judgment: Judgment normal.         Assessment & Plan:   Problem List Items Addressed This Visit       Respiratory   Pharyngeal dysphagia    Gastritis and dysphagia will refer to GI start pantoprazole daily      Relevant Orders   Ambulatory referral to Gastroenterology     Endocrine   Type 2 diabetes mellitus - Primary    Patient is off metformin A1c at goal we will monitor off medication suspect metformin contributed to vomiting      Relevant Orders   HgB A1c (Completed)   Hypothyroidism (acquired)    Reassess thyroid function      Relevant Orders   Thyroid Panel With TSH   Hyperlipidemia associated with type 2 diabetes mellitus    Continue atorvastatin      Relevant Orders   Lipid panel     Genitourinary   Lesion of vulva    Referral to gynecology      Relevant Orders   Ambulatory referral to Gynecology   Other Visit Diagnoses     Gastroesophageal reflux disease without esophagitis       Relevant Medications   pantoprazole (PROTONIX) 40 MG tablet   ondansetron (ZOFRAN) 4 MG tablet     30 minutes spent extra time needed for language barrier and multisystem assessments Return in about 4 months (around 06/09/2023) for hyperlipidemia, diabetes.   Shan Levans, MD

## 2023-02-07 NOTE — Assessment & Plan Note (Signed)
Reassess thyroid function ?

## 2023-02-07 NOTE — Assessment & Plan Note (Signed)
Gastritis and dysphagia will refer to GI start pantoprazole daily

## 2023-02-07 NOTE — Assessment & Plan Note (Signed)
Patient is off metformin A1c at goal we will monitor off medication suspect metformin contributed to vomiting

## 2023-02-07 NOTE — Assessment & Plan Note (Signed)
Continue atorvastatin

## 2023-02-07 NOTE — Patient Instructions (Addendum)
Referral to gynecology made to reassess your vagina area  Stop metformin Stop Claritin  Labs today include thyroid function and cholesterol panel  Start pantoprazole daily and use Zofran as needed for nausea  Referral to stomach doctor will be made   Return to Dr. Delford Field 4 months  We will call you results of lab we may need to adjust the thyroid pill  Your hemoglobin A1c is excellent at 6.1 no medications for now are needed  Remisin a ginecologa realizada para reevaluar el rea de su vagina.  Dejar de metformina Deja de claritina  Los laboratorios de hoy incluyen panel de funcin tiroidea y colesterol.  Comience con pantoprazol diariamente y use Zofran segn sea necesario para las nuseas.  Se realizar una derivacin a un mdico estomacal.  Regreso al Dr. Delford Field 4 meses  Le llamaremos los [place] del laboratorio, es posible que necesitemos ajustar la pastilla de tiroides.  Su hemoglobina A1c es excelente en 6.1 no se necesitan medicamentos por ahora

## 2023-02-08 LAB — LIPID PANEL
Chol/HDL Ratio: 4.2 ratio (ref 0.0–4.4)
Cholesterol, Total: 189 mg/dL (ref 100–199)
HDL: 45 mg/dL (ref >39)
LDL Chol Calc (NIH): 116 mg/dL — ABNORMAL HIGH (ref 0–99)
Triglycerides: 156 mg/dL — ABNORMAL HIGH (ref 0–149)
VLDL Cholesterol Cal: 28 mg/dL (ref 5–40)

## 2023-02-08 LAB — THYROID PANEL WITH TSH
Free Thyroxine Index: 3.3 (ref 1.2–4.9)
T3 Uptake Ratio: 26 % (ref 24–39)
T4, Total: 12.8 ug/dL — ABNORMAL HIGH (ref 4.5–12.0)
TSH: 3.06 u[IU]/mL (ref 0.450–4.500)

## 2023-02-08 NOTE — Progress Notes (Signed)
Let pt cholesterol still high ,stay on cholesterol pill daily, thyroid is normal no change in thyroid medication dosing

## 2023-02-12 ENCOUNTER — Telehealth: Payer: Self-pay

## 2023-02-12 NOTE — Telephone Encounter (Signed)
-----   Message from Storm Frisk, MD sent at 02/08/2023  6:49 AM EDT ----- Let pt cholesterol still high ,stay on cholesterol pill daily, thyroid is normal no change in thyroid medication dosing

## 2023-02-12 NOTE — Telephone Encounter (Signed)
Called patient unable to leave voicemail or make contact, patient called twice    Interpreter id [phone]

## 2023-02-13 ENCOUNTER — Telehealth: Payer: Self-pay | Admitting: Critical Care Medicine

## 2023-02-13 NOTE — Telephone Encounter (Signed)
Copied from CRM [id number]. Topic: General - Other >> Feb 12, 2023  2:49 PM Ja-Kwan M wrote: Reason for CRM: Pt daughter requests that pt be scheduled for an appt to get both the shingles and tetanus shots. She also would like to know the cost of the vaccinations. Cb# [phone]

## 2023-02-13 NOTE — Telephone Encounter (Signed)
Copied from CRM #[id number]. Topic: General - Other >> Feb 12, 2023  2:49 PM Ja-Kwan M wrote: Reason for CRM: Pt daughter requests that pt be scheduled for an appt to get both the shingles and tetanus shots. She also would like to know the cost of the vaccinations. Cb# [phone] 

## 2023-02-22 ENCOUNTER — Ambulatory Visit (INDEPENDENT_AMBULATORY_CARE_PROVIDER_SITE_OTHER): Payer: Self-pay | Admitting: Gastroenterology

## 2023-02-22 ENCOUNTER — Encounter: Payer: Self-pay | Admitting: Gastroenterology

## 2023-02-22 VITALS — BP 122/80 | HR 79 | Ht 64.0 in | Wt 203.0 lb

## 2023-02-22 DIAGNOSIS — D509 Iron deficiency anemia, unspecified: Secondary | ICD-10-CM

## 2023-02-22 DIAGNOSIS — R7989 Other specified abnormal findings of blood chemistry: Secondary | ICD-10-CM

## 2023-02-22 DIAGNOSIS — R634 Abnormal weight loss: Secondary | ICD-10-CM

## 2023-02-22 DIAGNOSIS — R112 Nausea with vomiting, unspecified: Secondary | ICD-10-CM

## 2023-02-22 DIAGNOSIS — R12 Heartburn: Secondary | ICD-10-CM

## 2023-02-22 NOTE — Patient Instructions (Addendum)
_______________________________________________________  If your blood pressure at your visit was 140/90 or greater, please contact your primary care physician to follow up on this.  _______________________________________________________  If you are age 57 or older, your body mass index should be between 23-30. Your Body mass index is 34.84 kg/m. If this is out of the aforementioned range listed, please consider follow up with your Primary Care Provider.  If you are age 17 or younger, your body mass index should be between 19-25. Your Body mass index is 34.84 kg/m. If this is out of the aformentioned range listed, please consider follow up with your Primary Care Provider.   ________________________________________________________  The [facility] providers would like to encourage you to use [facility] to communicate with providers for non-urgent requests or questions.  Due to long hold times on the telephone, sending your provider a message by [facility] may be a faster and more efficient way to get a response.  Please allow 48 business hours for a response.  Please remember that this is for non-urgent requests.  _______________________________________________________  Bethany Vance have been scheduled for an endoscopy. Please follow written instructions given to you at your visit today. If you use inhalers (even only as needed), please bring them with you on the day of your procedure.  Due to recent changes in healthcare laws, you may see the results of your imaging and laboratory studies on MyChart before your provider has had a chance to review them.  We understand that in some cases there may be results that are confusing or concerning to you. Not all laboratory results come back in the same time frame and the provider may be waiting for multiple results in order to interpret others.  Please give Bethany Vance 48 hours in order for your provider to thoroughly review all the results before contacting the office for  clarification of your results.   It was a pleasure to see you today!  Thank you for trusting me with your gastrointestinal care!     Si su presin arterial en su visita era 140/90 o ms, comunquese con su mdico de atencin primaria para Presenter, broadcasting.  _______________________________________________________  Si tiene 65 aos o ms, su ndice de masa corporal debe estar entre 23 y 30. Su ndice de masa corporal es de 34,84 kg/m. Si esto est fuera del rango mencionado anteriormente, considere realizar un seguimiento con su proveedor de Marine scientist.  Si tiene 64 aos o menos, su ndice de [employer] corporal debe estar entre 19 y 3. Su ndice de masa corporal es de 34,84 kg/m. Si esto est fuera del rango mencionado anteriormente, considere realizar un seguimiento con su proveedor de Marine scientist.  ________________________________________________________  [employer] de [facility] desean alentarlo a Chemical engineer MYCHART para comunicarse con los proveedores para solicitudes o preguntas que no sean urgentes. Debido a los Retail buyer de espera en el telfono, enviar un mensaje a su proveedor a travs de [employer] puede ser Neomia Dear manera ms rpida y eficiente de obtener una respuesta. Espere 48 horas hbiles para recibir [employer]. Recuerde que esto es para solicitudes no urgentes. _______________________________________________________  Bethany Vance programado una endoscopia. Siga las instrucciones escritas que se le dieron en su visita de hoy. Si [place] inhaladores (incluso solo cuando sea necesario), trigalos con usted el da de su procedimiento.  Debido a cambios recientes en las [address] de atencin mdica, es posible que vea los [place] de sus estudios de imgenes y de laboratorio en MyChart antes de que su proveedor  haya tenido la oportunidad de revisarlos. Entendemos que en algunos casos puede haber resultados que sean confusos o preocupantes para usted. No todos los  resultados de laboratorio llegan en el mismo perodo de tiempo y es posible que el proveedor est esperando varios resultados para Administrator, Civil Service. Dnos 48 horas para que su proveedor revise minuciosamente todos los resultados antes de comunicarse con la oficina para Engineer, structural.  Fue un placer verte hoy!  Gracias por confiarme tu cuidado gastrointestinal!

## 2023-02-22 NOTE — Progress Notes (Signed)
[facility] Consult Note:  History: Bethany Vance 02/22/2023  Referring provider: Storm Frisk, MD  Reason for consult/chief complaint: Dysphagia and Gastroesophageal Reflux (Pt having problems with acid reflux, t states after she eat she start to vomit )   Subjective  HPI:  57 year old referred by primary care and accompanied by her daughter and a Spanish interpreter for worsening upper GI symptoms. Since at least sometime last year, she has had frequent epigastric pain, feelings of early satiety with reflux and heartburn.  Symptoms are worsening in the last several months and accompanied by vomiting that now occurs nearly every day.  She has lost an unspecified amount of weight in the last 4 to 6 weeks as a result of the symptoms.  Intermittent dysphagia, no odynophagia.  Little or no improvement on PPI or antiemetic. This patient established care with Dr. Shan Levans 3 months ago, and was most recently seen 2 weeks ago.  At that visit, she was complaining of at least 3 months of nausea and vomiting that seem to coincide with starting metformin.  She was also diagnosed with hypothyroidism on the January visit and started on levothyroxine.  Hemoglobin A1c 6.1.  Occasional Advil use for headaches, no aspirin use.  No known family history of ulcers or stomach cancer. ROS:  Review of Systems  Constitutional:  Positive for unexpected weight change. Negative for appetite change.  HENT:  Negative for mouth sores and voice change.   Eyes:  Negative for pain and redness.  Respiratory:  Negative for cough and shortness of breath.   Cardiovascular:  Negative for chest pain and palpitations.  Genitourinary:  Negative for dysuria and hematuria.  Musculoskeletal:  Negative for arthralgias and myalgias.  Skin:  Negative for pallor and rash.  Neurological:  Negative for weakness and headaches.  Hematological:  Negative for adenopathy.     Past Medical  History: Past Medical History:  Diagnosis Date   Diabetes mellitus without complication (HCC)    Hyperlipidemia associated with type 2 diabetes mellitus (HCC) 02/07/2023   Stroke [facility])    Thyroid disease      Past Surgical History: Past Surgical History:  Procedure Laterality Date   ABDOMINAL HYSTERECTOMY     CESAREAN SECTION     She reports having had what sounds like a precancerous condition that required the hysterectomy.  No other treatments were needed afterward.  Family History: Family History  Problem Relation Age of Onset   Hearing loss Mother    Diabetes Father    Diabetes Maternal Grandmother    Diabetes Maternal Grandfather    Diabetes Paternal Grandmother    Diabetes Paternal Grandfather    Colon cancer Neg Hx    Stomach cancer Neg Hx    Esophageal varices Neg Hx     Social History: Social History   Socioeconomic History   Marital status: Married    Spouse name: Not on file   Number of children: 3   Years of education: Not on file   Highest education level: Not on file  Occupational History   Occupation: home maker  Tobacco Use   Smoking status: Every Day    Years: 20    Types: Cigarettes   Smokeless tobacco: Never   Tobacco comments:    Smokes 2 cigs a day   Building services engineer Use: Never used  Substance and Sexual Activity   Alcohol use: No   Drug use: No   Sexual activity: Yes  Other Topics  Concern   Not on file  Social History Narrative   Not on file   Social Determinants of Health   Financial Resource Strain: Not on file  Food Insecurity: Not on file  Transportation Needs: Not on file  Physical Activity: Not on file  Stress: Not on file  Social Connections: Not on file    MOST recent PCP note indicates patient was approved for the [place] financial coverage "Orange card"  Allergies: No Known Allergies  Outpatient Meds: Current Outpatient Medications  Medication Sig Dispense Refill   ondansetron (ZOFRAN) 4 MG tablet  Take 1 tablet (4 mg total) by mouth every 8 (eight) hours as needed for nausea or vomiting. 20 tablet 0   pantoprazole (PROTONIX) 40 MG tablet Take 1 tablet (40 mg total) by mouth daily. 30 tablet 3   atorvastatin (LIPITOR) 10 MG tablet Take 1 tablet (10 mg total) by mouth daily. (Patient not taking: Reported on 02/22/2023) 90 tablet 3   levothyroxine (SYNTHROID) 100 MCG tablet Take 1 tablet (100 mcg total) by mouth daily. (Patient not taking: Reported on 02/22/2023) 90 tablet 1   No current facility-administered medications for this visit.      ___________________________________________________________________ Objective   Exam:  BP 122/80   Pulse 79   Ht 5\' 4"  (1.626 m)   Wt 203 lb (92.1 kg)   BMI 34.84 kg/m  Wt Readings from Last 3 Encounters:  02/22/23 203 lb (92.1 kg)  02/07/23 205 lb 12.8 oz (93.4 kg)  01/07/23 206 lb 3.2 oz (93.5 kg)  Weight down a few pounds in the last month. Daughter and interpreter present for entire visit General: Well-appearing.  She had to take Zofran during the visit because of nausea Eyes: sclera anicteric, no redness ENT: oral mucosa moist without lesions, no cervical or supraclavicular lymphadenopathy.  No appreciable thyromegaly  CV: Regular without appreciable murmur, no JVD, no peripheral edema Resp: clear to auscultation bilaterally, normal RR and effort noted GI: soft, epigastric and (to a lesser extent) right upper quadrant tenderness, with active bowel sounds. No guarding or palpable organomegaly noted.  No bruit Skin; warm and dry, no rash or jaundice noted Neuro: awake, alert and oriented x 3. Normal gross motor function and fluent speech  Labs:     Latest Ref Rng & Units 11/08/2022    9:53 AM 02/14/2014    1:57 PM  CBC  WBC 3.4 - 10.8 x10E3/uL 10.5  11.3   Hemoglobin 11.1 - 15.9 g/dL 16.1  09.6   Hematocrit 34.0 - 46.6 % 36.6  36.8   Platelets 150 - 450 x10E3/uL 382  337       Latest Ref Rng & Units 11/08/2022    9:53 AM  02/14/2014    1:57 PM  CMP  Glucose 70 - 99 mg/dL 90  045   BUN 6 - 24 mg/dL 17  13   Creatinine 4.09 - 1.00 mg/dL 8.11  9.14   Sodium 782 - 144 mmol/L 136  139   Potassium 3.5 - 5.2 mmol/L 3.9  3.8   Chloride 96 - 106 mmol/L 101  100   CO2 20 - 29 mmol/L 23  24   Calcium 8.7 - 10.2 mg/dL 9.4  9.3   Total Protein 6.0 - 8.5 g/dL 7.6  7.9   Total Bilirubin 0.0 - 1.2 mg/dL <9.5  0.2   Alkaline Phos 44 - 121 IU/L 127  100   AST 0 - 40 IU/L 19  21   ALT  0 - 32 IU/L 17  23    Albumin 3.9  On 11/08/2022, TSH markedly elevated at 53 with a T4 of 4.8, free thyroxine index decreased at 0.7 and T3 uptake ratio decreased at 15  On 02/07/2023 (while on levothyroxine), TSH normalized to 3.06, T4 now elevated at 12.8 cc upper limit of normal 12), FTI normal at 3.3, T3 uptake normal at 26 PCP result note reviewed, no change to thyroid medication made with these results  POC Hemoglobin A1c 6.1 on 02/07/2023   Radiologic Studies: No abdominal imaging  Assessment: Encounter Diagnoses  Name Primary?   Nausea and vomiting in adult Yes   Weight loss    Heartburn    Microcytic anemia    LFTs abnormal     Worrisome symptoms since last year, escalating in recent months with upper abdominal pain, nausea vomiting weight loss.  She also has a microcytic anemia of unclear cause in relation to symptoms.  It would not be menstrual blood loss and she has had a hysterectomy. No prior colorectal cancer screening, does not seem to be describing the change in bowel habits and she denies rectal bleeding. Slightly elevated alkaline phosphatase of unclear cause and significance of symptoms.  Consider very early biliary obstruction or choledocholithiasis.  Possibly chronic cholecystitis.  Plan:  Upper endoscopy next week.  She was agreeable after thorough discussion of procedure and risks along with diagrams of the anatomy.  The benefits and risks of the planned procedure were described in detail with the patient  or (when appropriate) their health care proxy.  Risks were outlined as including, but not limited to, bleeding, infection, perforation, adverse medication reaction leading to cardiac or pulmonary decompensation, pancreatitis (if ERCP).  The limitation of incomplete mucosal visualization was also discussed.  No guarantees or warranties were given.  If EGD unrevealing, CT scan abdomen and pelvis with contrast to follow shortly afterward.  Thank you for the courtesy of this consult.  Please call me with any questions or concerns.  Charlie Pitter III  CC: Referring provider noted above

## 2023-02-28 ENCOUNTER — Ambulatory Visit (AMBULATORY_SURGERY_CENTER): Payer: Self-pay | Admitting: Gastroenterology

## 2023-02-28 ENCOUNTER — Encounter: Payer: Self-pay | Admitting: Gastroenterology

## 2023-02-28 VITALS — BP 125/83 | HR 66 | Temp 97.5°F | Resp 14 | Ht 64.0 in | Wt 203.0 lb

## 2023-02-28 DIAGNOSIS — R634 Abnormal weight loss: Secondary | ICD-10-CM

## 2023-02-28 DIAGNOSIS — K319 Disease of stomach and duodenum, unspecified: Secondary | ICD-10-CM

## 2023-02-28 DIAGNOSIS — R112 Nausea with vomiting, unspecified: Secondary | ICD-10-CM

## 2023-02-28 MED ORDER — SODIUM CHLORIDE 0.9 % IV SOLN
500.0000 mL | INTRAVENOUS | Status: DC
Start: 2023-02-28 — End: 2023-02-28

## 2023-02-28 NOTE — Patient Instructions (Addendum)
YOU HAD AN ENDOSCOPIC PROCEDURE TODAY AT THE [facility]:   Refer to the procedure report that was given to you for any specific questions about what was found during the examination.  If the procedure report does not answer your questions, please call your gastroenterologist to clarify.  If you requested that your care partner not be given the details of your procedure findings, then the procedure report has been included in a sealed envelope for you to review at your convenience later.  YOU SHOULD EXPECT: Some feelings of bloating in the abdomen. Passage of more gas than usual.  Walking can help get rid of the air that was put into your GI tract during the procedure and reduce the bloating. If you had a lower endoscopy (such as a colonoscopy or flexible sigmoidoscopy) you may notice spotting of blood in your stool or on the toilet paper. If you underwent a bowel prep for your procedure, you may not have a normal bowel movement for a few days.  Please Note:  You might notice some irritation and congestion in your nose or some drainage.  This is from the oxygen used during your procedure.  There is no need for concern and it should clear up in a day or so.  SYMPTOMS TO REPORT IMMEDIATELY:  Following upper endoscopy (EGD)  Vomiting of blood or coffee ground material  New chest pain or pain under the shoulder blades  Painful or persistently difficult swallowing  New shortness of breath  Fever of 100F or higher  Black, tarry-looking stools  For urgent or emergent issues, a gastroenterologist can be reached at any hour by calling [phone]. Do not use MyChart messaging for urgent concerns.    DIET:  We do recommend a small meal at first, but then you may proceed to your regular diet.  Drink plenty of fluids but you should avoid alcoholic beverages for 24 hours.  ACTIVITY:  You should plan to take it easy for the rest of today and you should NOT DRIVE or use heavy machinery until  tomorrow (because of the sedation medicines used during the test).    FOLLOW UP: Our staff will call the number listed on your records the next business day following your procedure.  We will call around 7:15- 8:00 am to check on you and address any questions or concerns that you may have regarding the information given to you following your procedure. If we do not reach you, we will leave a message.     If any biopsies were taken you will be contacted by phone or by letter within the next 1-3 weeks.  Please call us at [phone] if you have not heard about the biopsies in 3 weeks.    SIGNATURES/CONFIDENTIALITY: You and/or your care partner have signed paperwork which will be entered into your electronic medical record.  These signatures attest to the fact that that the information above on your After Visit Summary has been reviewed and is understood.  Full responsibility of the confidentiality of this discharge information lies with you and/or your care-partner.   USTED TUVO UN PROCEDIMIENTO ENDOSCPICO HOY EN EL [facility]:   Lea el informe del procedimiento que se le entreg para cualquier pregunta especfica sobre lo que se Dentist.  Si el informe del examen no responde a sus preguntas, por favor llame a su gastroenterlogo para aclararlo.  Si usted solicit que no se le den [employer] de lo que se Clinical cytogeneticist  en su procedimiento al acompaante que le va a cuidar, entonces el informe del procedimiento se ha incluido en un sobre sellado para que usted lo revise despus cuando le sea ms conveniente.   LO QUE PUEDE ESPERAR: Algunas sensaciones de hinchazn en el abdomen.  Puede tener ms gases de lo normal.  El caminar puede ayudarle a eliminar el aire que se le puso en el tracto gastrointestinal durante el procedimiento y reducir la hinchazn.  Si le hicieron una endoscopia inferior (como una colonoscopia o una sigmoidoscopia flexible), podra notar manchas de sangre  en las heces fecales o en el papel higinico.  Si se someti a una preparacin intestinal para su procedimiento, es posible que no tenga una evacuacin intestinal normal durante [employer].   Tenga en cuenta:  Es posible que note un poco de irritacin y congestin en la nariz o algn drenaje.  Esto es debido al oxgeno [employer] durante su procedimiento.  No hay que preocuparse y esto debe desaparecer ms o Regulatory affairs officer.   SNTOMAS PARA REPORTAR INMEDIATAMENTE:   Despus de la endoscopia superior (EGD)  Vmitos de Retail buyer o material como caf molido   Dolor en el pecho o dolor debajo de los omplatos que antes no tena   Dolor o dificultad persistente para tragar  Falta de aire que antes no tena   Fiebre de 100F o ms  Heces fecales negras y pegajosas   Para asuntos urgentes o de Associate Professor, puede comunicarse con un gastroenterlogo a cualquier hora llamando al [phone].  DIETA:  Recomendamos una comida pequea al principio, pero luego puede continuar con su dieta normal.  Tome muchos lquidos, Tax adviser las bebidas alcohlicas durante 24 horas.    ACTIVIDAD:  Debe planear tomarse las cosas con calma por el resto del da y no debe CONDUCIR ni usar maquinaria pesada Patent examiner (debido a los medicamentos de sedacin utilizados durante el examen).     SEGUIMIENTO: Nuestro personal llamar al nmero que aparece en su historial al siguiente da hbil de su procedimiento para ver cmo se siente y para responder cualquier pregunta o inquietud que pueda tener con respecto a la informacin que se le dio despus del procedimiento. Si no podemos contactarle, le dejaremos un mensaje.  Sin embargo, si se siente bien y no tiene English as a second language teacher, no es necesario que nos devuelva la llamada.  Asumiremos que ha regresado a sus actividades diarias normales sin incidentes. Si se le tomaron algunas biopsias, le contactaremos por telfono o por carta en las prximas 3 semanas.  Si no ha sabido  [employer] biopsias en el transcurso de 3 semanas, por favor llmenos al [phone].   FIRMAS/CONFIDENCIALIDAD: Usted y/o el acompaante que le cuide han firmado documentos que se ingresarn en su historial mdico electrnico.  Estas firmas atestiguan el hecho de que la informacin anterior

## 2023-02-28 NOTE — Progress Notes (Signed)
Uneventful anesthetic. Report to pacu rn. Vss on [place] O2. Care resumed by rn.

## 2023-02-28 NOTE — Op Note (Signed)
[facility] Patient Name: Bethany Vance Procedure Date: 02/28/2023 9:46 AM MRN: [id number] Endoscopist: Sherilyn Cooter L. Myrtie Neither , MD, [id number] Age: 57 Referring MD:  Date of Birth: 02/15/1966 Gender: Female Account #: [id number] Procedure:                Upper GI endoscopy Indications:              Epigastric abdominal pain, Nausea with vomiting,                            Weight loss                           Clinical details in office consult note last week. Medicines:                Monitored Anesthesia Care Procedure:                Pre-Anesthesia Assessment:                           - Prior to the procedure, a History and Physical                            was performed, and patient medications and                            allergies were reviewed. The patient's tolerance of                            previous anesthesia was also reviewed. The risks                            and benefits of the procedure and the sedation                            options and risks were discussed with the patient.                            All questions were answered, and informed consent                            was obtained. Prior Anticoagulants: The patient has                            taken no anticoagulant or antiplatelet agents. ASA                            Grade Assessment: II - A patient with mild systemic                            disease. After reviewing the risks and benefits,                            the patient was deemed in satisfactory condition to  undergo the procedure.                           After obtaining informed consent, the endoscope was                            passed under direct vision. Throughout the                            procedure, the patient's blood pressure, pulse, and                            oxygen saturations were monitored continuously. The                            GIF [id number] #[id number] was introduced  through the                            mouth, and advanced to the second part of duodenum.                            The upper GI endoscopy was accomplished without                            difficulty. The patient tolerated the procedure                            well. Scope In: Scope Out: Findings:                 The larynx was normal.                           The esophagus was normal.                           Diffuse mucosal changes with thickened edematous                            folds and lacy white discoloration were found in                            the entire examined stomach. Multiple biopsies were                            obtained on the greater curvature of the gastric                            body, on the lesser curvature of the gastric body,                            on the greater curvature of the gastric antrum and                            on the lesser curvature of the gastric antrum  with                            cold forceps for histology. (Antrum jar 1, gastric                            body jar 2)                           The cardia and gastric fundus were normal on                            retroflexion. The stomach distended well with                            insufflation.                           The examined duodenum was normal. Complications:            No immediate complications. Estimated Blood Loss:     Estimated blood loss was minimal. Impression:               - Normal larynx.                           - Normal esophagus.                           - Mucosal changes in the stomach.                           - Normal examined duodenum.                           - Multiple biopsies were obtained on the greater                            curvature of the gastric body, on the lesser                            curvature of the gastric body, on the greater                            curvature of the gastric antrum and on the lesser                             curvature of the gastric antrum. Recommendation:           - Patient has a contact number available for                            emergencies. The signs and symptoms of potential                            delayed complications were discussed with the  patient. Return to normal activities tomorrow.                            Written discharge instructions were provided to the                            patient.                           - Resume previous diet.                           - Continue present medications.                           - Await pathology results. If unrevealing, CT scan                            abdomen and pelvis. Bethany Vance L. Myrtie Neither, MD 02/28/2023 10:17:57 AM This report has been signed electronically.

## 2023-02-28 NOTE — Progress Notes (Signed)
Called to room to assist during endoscopic procedure.  Patient ID and intended procedure confirmed with present staff. Received instructions for my participation in the procedure from the performing physician.  

## 2023-02-28 NOTE — Progress Notes (Signed)
Pt's states no medical or surgical changes since previsit or office visit. 

## 2023-02-28 NOTE — Progress Notes (Signed)
No changes to clinical history since GI office visit on 02/22/23.  The patient is appropriate for an endoscopic procedure in the ambulatory setting.  - Amada Jupiter, MD

## 2023-03-01 ENCOUNTER — Telehealth: Payer: Self-pay | Admitting: *Deleted

## 2023-03-01 NOTE — Telephone Encounter (Signed)
No voicemail reached,no answer on call back

## 2023-03-06 ENCOUNTER — Other Ambulatory Visit: Payer: Self-pay

## 2023-03-06 DIAGNOSIS — R112 Nausea with vomiting, unspecified: Secondary | ICD-10-CM

## 2023-03-06 DIAGNOSIS — R634 Abnormal weight loss: Secondary | ICD-10-CM

## 2023-03-21 ENCOUNTER — Ambulatory Visit (HOSPITAL_COMMUNITY)
Admission: RE | Admit: 2023-03-21 | Discharge: 2023-03-21 | Disposition: A | Discharge status: Home / Self Care | Payer: Self-pay | Source: Ambulatory Visit | Attending: Gastroenterology | Admitting: Gastroenterology

## 2023-03-21 DIAGNOSIS — R634 Abnormal weight loss: Secondary | ICD-10-CM

## 2023-03-21 DIAGNOSIS — R112 Nausea with vomiting, unspecified: Secondary | ICD-10-CM

## 2023-03-21 MED ORDER — IOHEXOL 300 MG/ML  SOLN
100.0000 mL | Freq: Once | INTRAMUSCULAR | Status: AC | PRN
  Administered 2023-03-21: 100 mL via INTRAVENOUS

## 2023-03-29 ENCOUNTER — Other Ambulatory Visit: Payer: Self-pay

## 2023-03-29 DIAGNOSIS — R112 Nausea with vomiting, unspecified: Secondary | ICD-10-CM

## 2023-03-29 DIAGNOSIS — R634 Abnormal weight loss: Secondary | ICD-10-CM

## 2023-04-19 ENCOUNTER — Encounter (HOSPITAL_COMMUNITY)
Admission: RE | Admit: 2023-04-19 | Discharge: 2023-04-19 | Disposition: A | Discharge status: Home / Self Care | Payer: Self-pay | Source: Ambulatory Visit | Attending: Gastroenterology | Admitting: Gastroenterology

## 2023-04-19 DIAGNOSIS — R634 Abnormal weight loss: Secondary | ICD-10-CM | POA: Insufficient documentation

## 2023-04-19 DIAGNOSIS — R112 Nausea with vomiting, unspecified: Secondary | ICD-10-CM | POA: Insufficient documentation

## 2023-04-19 MED ORDER — TECHNETIUM TC 99M SULFUR COLLOID
2.2000 | Freq: Once | INTRAVENOUS | Status: AC
  Administered 2023-04-19: 2.2 via ORAL

## 2023-05-07 ENCOUNTER — Other Ambulatory Visit (HOSPITAL_BASED_OUTPATIENT_CLINIC_OR_DEPARTMENT_OTHER): Payer: Self-pay

## 2023-06-12 ENCOUNTER — Ambulatory Visit: Payer: No Typology Code available for payment source | Admitting: Critical Care Medicine

## 2023-06-12 NOTE — Progress Notes (Unsigned)
New Patient Office Visit  Subjective    Patient ID: Bethany Vance, female    DOB: 19-Nov-1965  Age: 57 y.o. MRN: [id number]  CC:  No chief complaint on file.   HPI 11/08/22 Bethany Vance presents to establish care This visit was assisted by in person interpreter Graciela for Spanish The patient presents with prior history of type 2 diabetes and hypothyroidism.  Her daughter [name] is in the room as well.  They are trying to apply for the orange card.  She previously was seen at the [facility] on [address] but has not been seen in 6 months.  She has a prior history of a CVA with headache presentation in 2009 currently is asymptomatic.  She is not taking any aspirin at this time.  She needs a mammogram and Pap smear.  She also has poor dental hygiene will need a dental exam when she gets the orange card.  Diabetes runs in her family.  She does smoke 3 cigarettes daily.  She was on metformin and Synthroid previously but does not check her blood sugar regularly.   02/07/23 Patient seen for return follow-up visit assisted by 1 video interpreter for Spanish [phone].  The patient has multiple complaints.  She has had nausea and emesis for 3 months.  This appeared to occur when metformin was started and she experienced onset of severe reflux symptoms.  She had hypothyroidism we started her on levothyroxine this needs to be rechecked.  On arrival A1c is 6.1 and blood pressure is 106/72.  She also has a vulvar lesion which needs to be followed up by gynecology.  She does now have the orange card.  8/15  Outpatient Encounter Medications as of 06/13/2023  Medication Sig   atorvastatin (LIPITOR) 10 MG tablet Take 1 tablet (10 mg total) by mouth daily. (Patient not taking: Reported on 02/22/2023)   levothyroxine (SYNTHROID) 100 MCG tablet Take 1 tablet (100 mcg total) by mouth daily. (Patient not taking: Reported on 02/22/2023)   ondansetron (ZOFRAN) 4 MG tablet Take 1  tablet (4 mg total) by mouth every 8 (eight) hours as needed for nausea or vomiting.   pantoprazole (PROTONIX) 40 MG tablet Take 1 tablet (40 mg total) by mouth daily.   No facility-administered encounter medications on file as of 06/13/2023.    Past Medical History:  Diagnosis Date   Diabetes mellitus without complication (HCC)    Hyperlipidemia associated with type 2 diabetes mellitus (HCC) 02/07/2023   Stroke (HCC)    Thyroid disease     Past Surgical History:  Procedure Laterality Date   ABDOMINAL HYSTERECTOMY     CESAREAN SECTION      Family History  Problem Relation Age of Onset   Hearing loss Mother    Diabetes Father    Diabetes Maternal Grandmother    Diabetes Maternal Grandfather    Diabetes Paternal Grandmother    Diabetes Paternal Grandfather    Colon cancer Neg Hx    Stomach cancer Neg Hx    Esophageal varices Neg Hx     Social History   Socioeconomic History   Marital status: Married    Spouse name: Not on file   Number of children: 3   Years of education: Not on file   Highest education level: Not on file  Occupational History   Occupation: home maker  Tobacco Use   Smoking status: Every Day    Types: Cigarettes   Smokeless tobacco: Never  Tobacco comments:    Smokes 2 cigs a day   Vaping Use   Vaping status: Never Used  Substance and Sexual Activity   Alcohol use: No   Drug use: No   Sexual activity: Yes  Other Topics Concern   Not on file  Social History Narrative   Not on file   Social Determinants of Health   Financial Resource Strain: Not on file  Food Insecurity: Not on file  Transportation Needs: Not on file  Physical Activity: Not on file  Stress: Not on file  Social Connections: Not on file  Intimate Partner Violence: Not on file    Review of Systems  Constitutional:  Negative for chills, diaphoresis, fever, malaise/fatigue and weight loss.  HENT:  Negative for congestion, hearing loss, nosebleeds, sore throat and  tinnitus.   Eyes:  Negative for blurred vision, photophobia and redness.  Respiratory:  Negative for cough, hemoptysis, sputum production, shortness of breath, wheezing and stridor.   Cardiovascular:  Negative for chest pain, palpitations, orthopnea, claudication, leg swelling and PND.  Gastrointestinal:  Positive for nausea and vomiting. Negative for abdominal pain, blood in stool, constipation, diarrhea and heartburn.  Genitourinary:  Negative for dysuria, flank pain, frequency, hematuria and urgency.  Musculoskeletal:  Negative for back pain, falls, joint pain, myalgias and neck pain.  Skin:  Negative for itching and rash.  Neurological:  Negative for dizziness, tingling, tremors, sensory change, speech change, focal weakness, seizures, loss of consciousness, weakness and headaches.  Endo/Heme/Allergies:  Negative for environmental allergies and polydipsia. Does not bruise/bleed easily.  Psychiatric/Behavioral:  Negative for depression, memory loss, substance abuse and suicidal ideas. The patient is not nervous/anxious and does not have insomnia.         Objective    There were no vitals taken for this visit.  Physical Exam Vitals reviewed.  Constitutional:      Appearance: Normal appearance. She is well-developed. She is obese. She is not diaphoretic.  HENT:     Head: Normocephalic and atraumatic.     Nose: No nasal deformity, septal deviation, mucosal edema or rhinorrhea.     Right Sinus: No maxillary sinus tenderness or frontal sinus tenderness.     Left Sinus: No maxillary sinus tenderness or frontal sinus tenderness.     Mouth/Throat:     Pharynx: No oropharyngeal exudate.     Comments: Poor dentition with molar decay in both upper lower third molars Eyes:     General: No scleral icterus.    Conjunctiva/sclera: Conjunctivae normal.     Pupils: Pupils are equal, round, and reactive to light.  Neck:     Thyroid: No thyromegaly.     Vascular: No carotid bruit or JVD.      Trachea: Trachea normal. No tracheal tenderness or tracheal deviation.  Cardiovascular:     Rate and Rhythm: Normal rate and regular rhythm.     Chest Wall: PMI is not displaced.     Pulses: Normal pulses. No decreased pulses.     Heart sounds: Normal heart sounds, S1 normal and S2 normal. Heart sounds not distant. No murmur heard.    No systolic murmur is present.     No diastolic murmur is present.     No friction rub. No gallop. No S3 or S4 sounds.  Pulmonary:     Effort: No tachypnea, accessory muscle usage or respiratory distress.     Breath sounds: No stridor. No decreased breath sounds, wheezing, rhonchi or rales.  Chest:  Chest wall: No tenderness.  Abdominal:     General: Bowel sounds are normal. There is no distension.     Palpations: Abdomen is soft. Abdomen is not rigid.     Tenderness: There is no abdominal tenderness. There is no guarding or rebound.  Musculoskeletal:        General: Normal range of motion.     Cervical back: Normal range of motion and neck supple. No edema, erythema or rigidity. No muscular tenderness. Normal range of motion.  Lymphadenopathy:     Head:     Right side of head: No submental or submandibular adenopathy.     Left side of head: No submental or submandibular adenopathy.     Cervical: No cervical adenopathy.  Skin:    General: Skin is warm and dry.     Coloration: Skin is not pale.     Findings: No rash.     Nails: There is no clubbing.  Neurological:     Mental Status: She is alert and oriented to person, place, and time.     Sensory: No sensory deficit.  Psychiatric:        Mood and Affect: Mood normal.        Speech: Speech normal.        Behavior: Behavior normal.        Thought Content: Thought content normal.        Judgment: Judgment normal.         Assessment & Plan:   Problem List Items Addressed This Visit   None  30 minutes spent extra time needed for language barrier and multisystem assessments No follow-ups  on file.   Shan Levans, MD

## 2023-06-13 ENCOUNTER — Ambulatory Visit: Payer: Self-pay | Attending: Critical Care Medicine | Admitting: Critical Care Medicine

## 2023-06-13 ENCOUNTER — Other Ambulatory Visit: Payer: Self-pay

## 2023-06-13 ENCOUNTER — Encounter: Payer: Self-pay | Admitting: Critical Care Medicine

## 2023-06-13 VITALS — BP 92/70 | HR 76 | Ht 64.0 in | Wt 204.4 lb

## 2023-06-13 DIAGNOSIS — R1313 Dysphagia, pharyngeal phase: Secondary | ICD-10-CM

## 2023-06-13 DIAGNOSIS — E785 Hyperlipidemia, unspecified: Secondary | ICD-10-CM

## 2023-06-13 DIAGNOSIS — R1115 Cyclical vomiting syndrome unrelated to migraine: Secondary | ICD-10-CM

## 2023-06-13 DIAGNOSIS — Z1211 Encounter for screening for malignant neoplasm of colon: Secondary | ICD-10-CM

## 2023-06-13 DIAGNOSIS — Z6835 Body mass index (BMI) 35.0-35.9, adult: Secondary | ICD-10-CM

## 2023-06-13 DIAGNOSIS — E669 Obesity, unspecified: Secondary | ICD-10-CM

## 2023-06-13 DIAGNOSIS — Z23 Encounter for immunization: Secondary | ICD-10-CM

## 2023-06-13 DIAGNOSIS — E119 Type 2 diabetes mellitus without complications: Secondary | ICD-10-CM

## 2023-06-13 DIAGNOSIS — E1169 Type 2 diabetes mellitus with other specified complication: Secondary | ICD-10-CM

## 2023-06-13 DIAGNOSIS — E039 Hypothyroidism, unspecified: Secondary | ICD-10-CM

## 2023-06-13 DIAGNOSIS — F17210 Nicotine dependence, cigarettes, uncomplicated: Secondary | ICD-10-CM

## 2023-06-13 MED ORDER — ONDANSETRON HCL 4 MG PO TABS
4.0000 mg | ORAL_TABLET | Freq: Three times a day (TID) | ORAL | 1 refills | Status: DC | PRN
  Filled 2023-06-13: qty 30, 10d supply, fill #0

## 2023-06-13 MED ORDER — PANTOPRAZOLE SODIUM 40 MG PO TBEC
40.0000 mg | DELAYED_RELEASE_TABLET | Freq: Every day | ORAL | 3 refills | Status: DC
  Filled 2023-06-13: qty 90, 90d supply, fill #0
  Filled 2023-09-09: qty 30, 30d supply, fill #1

## 2023-06-13 MED ORDER — ATORVASTATIN CALCIUM 10 MG PO TABS
10.0000 mg | ORAL_TABLET | Freq: Every day | ORAL | 3 refills | Status: DC
  Filled 2023-06-13: qty 90, 90d supply, fill #0
  Filled 2023-09-09: qty 90, 90d supply, fill #1

## 2023-06-13 MED ORDER — LEVOTHYROXINE SODIUM 100 MCG PO TABS
100.0000 ug | ORAL_TABLET | Freq: Every day | ORAL | 1 refills | Status: DC
  Filled 2023-06-13: qty 90, 90d supply, fill #0
  Filled 2023-09-09: qty 90, 90d supply, fill #1

## 2023-06-13 NOTE — Assessment & Plan Note (Signed)
Patient has had this symptom for a while but her upper endoscopy did not show esophageal stricture

## 2023-06-13 NOTE — Assessment & Plan Note (Signed)
Persistent nausea and emesis will give Zofran as needed and resume proton pump inhibitor and refer back to gastroenterology for any other suggestions

## 2023-06-13 NOTE — Assessment & Plan Note (Signed)
Resume lipid therapy

## 2023-06-13 NOTE — Assessment & Plan Note (Signed)
Patient still vomiting without metformin nevertheless will hold for now

## 2023-06-13 NOTE — Patient Instructions (Addendum)
REsume all medications Will have you go back to stomach doctor  Return Dr Delford Field 6 weeks   Reanudar todos los medicamentos Quieres que vuelvas al [facility] [place]?  Volver Dr. Delford Field 6 semanas

## 2023-06-13 NOTE — Assessment & Plan Note (Signed)
Reassess thyroid function and resume thyroid medication

## 2023-06-16 LAB — COMPREHENSIVE METABOLIC PANEL
ALT: 11 IU/L (ref 0–32)
AST: 19 IU/L (ref 0–40)
Albumin: 4.1 g/dL (ref 3.8–4.9)
Alkaline Phosphatase: 97 IU/L (ref 44–121)
BUN/Creatinine Ratio: 10 (ref 9–23)
BUN: 10 mg/dL (ref 6–24)
Bilirubin Total: 0.4 mg/dL (ref 0.0–1.2)
CO2: 25 mmol/L (ref 20–29)
Calcium: 9.4 mg/dL (ref 8.7–10.2)
Chloride: 100 mmol/L (ref 96–106)
Creatinine, Ser: 0.97 mg/dL (ref 0.57–1.00)
Globulin, Total: 2.9 g/dL (ref 1.5–4.5)
Glucose: 107 mg/dL — ABNORMAL HIGH (ref 70–99)
Potassium: 4.3 mmol/L (ref 3.5–5.2)
Sodium: 139 mmol/L (ref 134–144)
Total Protein: 7 g/dL (ref 6.0–8.5)
eGFR: 68 mL/min/{1.73_m2} (ref >59)

## 2023-06-16 LAB — THYROID PANEL WITH TSH
Free Thyroxine Index: 0.4 — ABNORMAL LOW (ref 1.2–4.9)
T3 Uptake Ratio: 14 % — ABNORMAL LOW (ref 24–39)
T4, Total: 3.2 ug/dL — ABNORMAL LOW (ref 4.5–12.0)
TSH: 120 u[IU]/mL — ABNORMAL HIGH (ref 0.450–4.500)

## 2023-06-16 LAB — CBC WITH DIFFERENTIAL/PLATELET
Basophils Absolute: 0.1 10*3/uL (ref 0.0–0.2)
Basos: 1 %
EOS (ABSOLUTE): 0.5 10*3/uL — ABNORMAL HIGH (ref 0.0–0.4)
Eos: 5 %
Hematocrit: 37.8 % (ref 34.0–46.6)
Hemoglobin: 12.5 g/dL (ref 11.1–15.9)
Immature Grans (Abs): 0.1 10*3/uL (ref 0.0–0.1)
Immature Granulocytes: 1 %
Lymphocytes Absolute: 3.8 10*3/uL — ABNORMAL HIGH (ref 0.7–3.1)
Lymphs: 36 %
MCH: 27.5 pg (ref 26.6–33.0)
MCHC: 33.1 g/dL (ref 31.5–35.7)
MCV: 83 fL (ref 79–97)
Monocytes Absolute: 0.6 10*3/uL (ref 0.1–0.9)
Monocytes: 5 %
Neutrophils Absolute: 5.5 10*3/uL (ref 1.4–7.0)
Neutrophils: 52 %
Platelets: 370 10*3/uL (ref 150–450)
RBC: 4.54 x10E6/uL (ref 3.77–5.28)
RDW: 15.5 % — ABNORMAL HIGH (ref 11.7–15.4)
WBC: 10.5 10*3/uL (ref 3.4–10.8)

## 2023-06-17 NOTE — Progress Notes (Signed)
Let pt know thyroid function was very low, likely all of patient symptoms were /are from low thyroid , take thyroid medication as prescribed Rest of labs normal

## 2023-06-19 ENCOUNTER — Telehealth: Payer: Self-pay

## 2023-06-19 NOTE — Telephone Encounter (Signed)
Pt was called and is aware of results, DOB was confirmed.  Interpreter id # [phone]

## 2023-06-19 NOTE — Telephone Encounter (Signed)
-----   Message from Shan Levans sent at 06/17/2023  6:56 AM EDT ----- Let pt know thyroid function was very low, likely all of patient symptoms were /are from low thyroid , take thyroid medication as prescribed Rest of labs normal

## 2023-07-18 ENCOUNTER — Encounter: Payer: Self-pay | Admitting: Critical Care Medicine

## 2023-07-25 ENCOUNTER — Ambulatory Visit: Payer: Self-pay | Attending: Critical Care Medicine | Admitting: Critical Care Medicine

## 2023-07-25 ENCOUNTER — Encounter: Payer: Self-pay | Admitting: Critical Care Medicine

## 2023-07-25 ENCOUNTER — Other Ambulatory Visit: Payer: Self-pay

## 2023-07-25 VITALS — BP 102/69 | HR 74 | Wt 199.8 lb

## 2023-07-25 DIAGNOSIS — E039 Hypothyroidism, unspecified: Secondary | ICD-10-CM

## 2023-07-25 DIAGNOSIS — Z7984 Long term (current) use of oral hypoglycemic drugs: Secondary | ICD-10-CM

## 2023-07-25 DIAGNOSIS — E119 Type 2 diabetes mellitus without complications: Secondary | ICD-10-CM

## 2023-07-25 DIAGNOSIS — R1115 Cyclical vomiting syndrome unrelated to migraine: Secondary | ICD-10-CM

## 2023-07-25 DIAGNOSIS — R1313 Dysphagia, pharyngeal phase: Secondary | ICD-10-CM

## 2023-07-25 NOTE — Assessment & Plan Note (Signed)
I will make another referral to gastroenterology and indicate she did not know was them calling her I gave the patient the number they will be calling from and told her to put in her contact folder and her phone  Discontinue Zofran continue pantoprazole

## 2023-07-25 NOTE — Assessment & Plan Note (Signed)
Not requiring medication

## 2023-07-25 NOTE — Progress Notes (Signed)
New Patient Office Visit  Subjective    Patient ID: Bethany Vance, female    DOB: [birth year]  Age: 57 y.o. MRN: [id number]  CC:  Chief Complaint  Patient presents with   Medication Refill    HPI 11/08/22 Bethany Vance presents to establish care This visit was assisted by in person interpreter Graciela for Spanish The patient presents with prior history of type 2 diabetes and hypothyroidism.  Her daughter [name] is in the room as well.  They are trying to apply for the orange card.  She previously was seen at the Spanish speaking clinic on [address] but has not been seen in 6 months.  She has a prior history of a CVA with headache presentation in 2009 currently is asymptomatic.  She is not taking any aspirin at this time.  She needs a mammogram and Pap smear.  She also has poor dental hygiene will need a dental exam when she gets the orange card.  Diabetes runs in her family.  She does smoke 3 cigarettes daily.  She was on metformin and Synthroid previously but does not check her blood sugar regularly.   02/07/23 Patient seen for return follow-up visit assisted by 1 video interpreter for Spanish [phone].  The patient has multiple complaints.  She has had nausea and emesis for 3 months.  This appeared to occur when metformin was started and she experienced onset of severe reflux symptoms.  She had hypothyroidism we started her on levothyroxine this needs to be rechecked.  On arrival A1c is 6.1 and blood pressure is 106/72.  She also has a vulvar lesion which needs to be followed up by gynecology.  She does now have the orange card.  8/15 The patient is seen in return follow-up and has a number of complaints visit assisted by Spanish video interpreter Marian Sorrow [phone].  Patient had nausea and vomiting in the past this has recurred for the past month.  She had a full workup for this earlier in the year which was essentially unremarkable per gastroenterology.  She was maintained on  a proton pump inhibitor which she has since stopped.  She is also been off her thyroid medication for several months.  She was under the interpretation from gastroenterology to stop all her medications while they worked her up and then the medicines were not resumed.  The overall time course of her symptoms have been as long as November of last year.  She is not smoking at this time.  Note she does not have a gallbladder it has been removed.  07/25/23 The patient was seen in return follow-up video Spanish interpreter bladimir [phone] was assisting  At the last visit in August she was having emesis nausea excess vomiting we referred to gastroenterology they tried to get her several times she would not answer the phone she tells [name] that she thought these were spam calls.  Note that when she was previously evaluated earlier this year she had gastropathy reactive but no evidence of esophageal stricture she does have difficulty swallowing yet her previous upper endoscopy was normal in the esophageal area and her gastric emptying study was normal  She states Zofran actually causes heartburn and makes her worse she is taking the pantoprazole.  Outpatient Encounter Medications as of 07/25/2023  Medication Sig   atorvastatin (LIPITOR) 10 MG tablet Take 1 tablet (10 mg total) by mouth daily.   levothyroxine (SYNTHROID) 100 MCG tablet Take 1 tablet (100 mcg total) by  mouth daily.   pantoprazole (PROTONIX) 40 MG tablet Take 1 tablet (40 mg total) by mouth daily.   [DISCONTINUED] ondansetron (ZOFRAN) 4 MG tablet Take 1 tablet (4 mg total) by mouth every 8 (eight) hours as needed for nausea or vomiting. (Patient not taking: Reported on 07/25/2023)   No facility-administered encounter medications on file as of 07/25/2023.    Past Medical History:  Diagnosis Date   Diabetes mellitus without complication (HCC)    Hyperlipidemia associated with type 2 diabetes mellitus (HCC) 02/07/2023   Stroke (HCC)    Thyroid  disease     Past Surgical History:  Procedure Laterality Date   ABDOMINAL HYSTERECTOMY     CESAREAN SECTION      Family History  Problem Relation Age of Onset   Hearing loss Mother    Diabetes Father    Diabetes Maternal Grandmother    Diabetes Maternal Grandfather    Diabetes Paternal Grandmother    Diabetes Paternal Grandfather    Colon cancer Neg Hx    Stomach cancer Neg Hx    Esophageal varices Neg Hx     Social History   Socioeconomic History   Marital status: Married    Spouse name: Not on file   Number of children: 3   Years of education: Not on file   Highest education level: Not on file  Occupational History   Occupation: home maker  Tobacco Use   Smoking status: Every Day    Types: Cigarettes   Smokeless tobacco: Never   Tobacco comments:    Smokes 2 cigs a day   Vaping Use   Vaping status: Never Used  Substance and Sexual Activity   Alcohol use: No   Drug use: No   Sexual activity: Yes  Other Topics Concern   Not on file  Social History Narrative   Not on file   Social Determinants of Health   Financial Resource Strain: Not on file  Food Insecurity: Not on file  Transportation Needs: Not on file  Physical Activity: Not on file  Stress: Not on file  Social Connections: Not on file  Intimate Partner Violence: Not on file    Review of Systems  Constitutional:  Negative for chills, diaphoresis, fever, malaise/fatigue and weight loss.  HENT:  Negative for congestion, hearing loss, nosebleeds, sore throat and tinnitus.   Eyes:  Negative for blurred vision, photophobia and redness.  Respiratory:  Negative for cough, hemoptysis, sputum production, shortness of breath, wheezing and stridor.   Cardiovascular:  Negative for chest pain, palpitations, orthopnea, claudication, leg swelling and PND.  Gastrointestinal:  Positive for nausea and vomiting. Negative for abdominal pain, blood in stool, constipation, diarrhea and heartburn.  Genitourinary:   Negative for dysuria, flank pain, frequency, hematuria and urgency.  Musculoskeletal:  Negative for back pain, falls, joint pain, myalgias and neck pain.  Skin:  Negative for itching and rash.  Neurological:  Negative for dizziness, tingling, tremors, sensory change, speech change, focal weakness, seizures, loss of consciousness, weakness and headaches.  Endo/Heme/Allergies:  Negative for environmental allergies and polydipsia. Does not bruise/bleed easily.  Psychiatric/Behavioral:  Negative for depression, memory loss, substance abuse and suicidal ideas. The patient is not nervous/anxious and does not have insomnia.         Objective    BP 102/69 (BP Location: Right Arm, Patient Position: Sitting, Cuff Size: Normal)   Pulse 74   Wt 199 lb 12.8 oz (90.6 kg)   SpO2 98%   BMI 34.30 kg/m  Physical Exam Vitals reviewed.  Constitutional:      Appearance: Normal appearance. She is well-developed. She is obese. She is not diaphoretic.  HENT:     Head: Normocephalic and atraumatic.     Nose: No nasal deformity, septal deviation, mucosal edema or rhinorrhea.     Right Sinus: No maxillary sinus tenderness or frontal sinus tenderness.     Left Sinus: No maxillary sinus tenderness or frontal sinus tenderness.     Mouth/Throat:     Pharynx: No oropharyngeal exudate.     Comments: Poor dentition with molar decay in both upper lower third molars Eyes:     General: No scleral icterus.    Conjunctiva/sclera: Conjunctivae normal.     Pupils: Pupils are equal, round, and reactive to light.  Neck:     Thyroid: No thyromegaly.     Vascular: No carotid bruit or JVD.     Trachea: Trachea normal. No tracheal tenderness or tracheal deviation.  Cardiovascular:     Rate and Rhythm: Normal rate and regular rhythm.     Chest Wall: PMI is not displaced.     Pulses: Normal pulses. No decreased pulses.     Heart sounds: Normal heart sounds, S1 normal and S2 normal. Heart sounds not distant. No murmur  heard.    No systolic murmur is present.     No diastolic murmur is present.     No friction rub. No gallop. No S3 or S4 sounds.  Pulmonary:     Effort: No tachypnea, accessory muscle usage or respiratory distress.     Breath sounds: No stridor. No decreased breath sounds, wheezing, rhonchi or rales.  Chest:     Chest wall: No tenderness.  Abdominal:     General: Bowel sounds are normal. There is no distension.     Palpations: Abdomen is soft. Abdomen is not rigid.     Tenderness: There is abdominal tenderness. There is no guarding or rebound.     Comments: Left lower quadrant tenderness  Musculoskeletal:        General: Normal range of motion.     Cervical back: Normal range of motion and neck supple. No edema, erythema or rigidity. No muscular tenderness. Normal range of motion.  Lymphadenopathy:     Head:     Right side of head: No submental or submandibular adenopathy.     Left side of head: No submental or submandibular adenopathy.     Cervical: No cervical adenopathy.  Skin:    General: Skin is warm and dry.     Coloration: Skin is not pale.     Findings: No rash.     Nails: There is no clubbing.  Neurological:     Mental Status: She is alert and oriented to person, place, and time.     Sensory: No sensory deficit.  Psychiatric:        Mood and Affect: Mood normal.        Speech: Speech normal.        Behavior: Behavior normal.        Thought Content: Thought content normal.        Judgment: Judgment normal.         Assessment & Plan:   Problem List Items Addressed This Visit       Respiratory   Pharyngeal dysphagia   Relevant Orders   Ambulatory referral to Gastroenterology     Digestive   Emesis, persistent - Primary    I will make another  referral to gastroenterology and indicate she did not know was them calling her I gave the patient the number they will be calling from and told her to put in her contact folder and her phone  Discontinue Zofran  continue pantoprazole      Relevant Orders   Ambulatory referral to Gastroenterology     Endocrine   Type 2 diabetes mellitus (HCC)    Not requiring medication      Hypothyroidism (acquired)   Relevant Orders   Thyroid Panel With TSH    30 minutes spent extra time needed for language barrier and multisystem assessments Return in about 4 months (around 11/24/2023) for primary care follow up.   Shan Levans, MD

## 2023-07-25 NOTE — Patient Instructions (Addendum)
Dr Myrtie Neither for appointment [phone] Stomach Doctor will call you for appointment  Labs today  REturn primary care 4 months  Dr Myrtie Neither para cita [phone] Stomach Doctor te llamar para cita  Laboratorios hoy  Retorno atencin primaria 4 meses

## 2023-07-26 LAB — THYROID PANEL WITH TSH
Free Thyroxine Index: 3 (ref 1.2–4.9)
T3 Uptake Ratio: 25 % (ref 24–39)
T4, Total: 11.8 ug/dL (ref 4.5–12.0)
TSH: 5.61 u[IU]/mL — ABNORMAL HIGH (ref 0.450–4.500)

## 2023-07-26 NOTE — Progress Notes (Signed)
Let pt know thyroid function improved and stay on thyroid pill

## 2023-07-30 ENCOUNTER — Telehealth: Payer: Self-pay

## 2023-07-30 NOTE — Telephone Encounter (Signed)
-----   Message from Shan Levans sent at 07/26/2023  6:47 AM EDT ----- Let pt know thyroid function improved and stay on thyroid pill

## 2023-07-30 NOTE — Telephone Encounter (Signed)
Called patient twice unable to make contact or leave voicemail, information sent to the nurse pool   Interpreter id # [phone]

## 2023-08-20 ENCOUNTER — Telehealth: Payer: Self-pay

## 2023-08-20 NOTE — Telephone Encounter (Signed)
Patient came in requesting a referral to a dentist. Patient has the [employer] and financial assistance.

## 2023-08-22 ENCOUNTER — Other Ambulatory Visit: Payer: Self-pay | Admitting: Physician Assistant

## 2023-08-22 DIAGNOSIS — K029 Dental caries, unspecified: Secondary | ICD-10-CM

## 2023-08-22 NOTE — Telephone Encounter (Signed)
Pt is needing dental referral.

## 2023-09-09 ENCOUNTER — Other Ambulatory Visit: Payer: Self-pay

## 2023-11-25 ENCOUNTER — Encounter: Payer: Self-pay | Admitting: Family Medicine

## 2023-11-25 ENCOUNTER — Ambulatory Visit: Payer: Self-pay | Attending: Family Medicine | Admitting: Family Medicine

## 2023-11-25 VITALS — BP 105/72 | HR 75 | Ht 64.0 in | Wt 215.2 lb

## 2023-11-25 DIAGNOSIS — E039 Hypothyroidism, unspecified: Secondary | ICD-10-CM

## 2023-11-25 DIAGNOSIS — E119 Type 2 diabetes mellitus without complications: Secondary | ICD-10-CM

## 2023-11-25 DIAGNOSIS — F17210 Nicotine dependence, cigarettes, uncomplicated: Secondary | ICD-10-CM

## 2023-11-25 DIAGNOSIS — K5909 Other constipation: Secondary | ICD-10-CM

## 2023-11-25 DIAGNOSIS — Z1211 Encounter for screening for malignant neoplasm of colon: Secondary | ICD-10-CM

## 2023-11-25 LAB — POCT GLYCOSYLATED HEMOGLOBIN (HGB A1C): HbA1c, POC (controlled diabetic range): 6.3 % (ref 0.0–7.0)

## 2023-11-25 NOTE — Progress Notes (Signed)
Subjective:  Patient ID: Bethany Vance, female    DOB: Dec 01, 1965  Age: 58 y.o. MRN: [id number]  CC: Medical Management of Chronic Issues (Abdominal pain for 2 weeks no urinary symptoms.)   HPI Bethany Vance is a 58 y.o. year old female with a history of hypothyroidism, type 2 diabetes mellitus  Interval History: Discussed the use of AI scribe software for clinical note transcription with the patient, who gave verbal consent to proceed.  She initially presented with abdominal pain that started two weeks ago. The pain was located in the right upper abdomen and was associated with bloating and discomfort that disrupted her sleep. The patient denied any relation of the pain to food intake.  Denied presence of urinary symptoms, nausea or vomiting.  The abdominal pain has since resolved.  She does endorse history of constipation. The patient has been taking medication for acid reflux, but reports that she no longer experiences symptoms of reflux.   She also takes levothyroxine for hypothyroidism and reports that she has been consistent with her medication. She is due for a refill in about a week. The patient also takes Atorvastatin for cholesterol management.  The patient's diabetes is well-controlled with a recent A1C of 6.3, and she manages her diabetes with diet and lifestyle modifications, without the need for medication.        Past Medical History:  Diagnosis Date   Diabetes mellitus without complication (HCC)    Hyperlipidemia associated with type 2 diabetes mellitus (HCC) 02/07/2023   Stroke (HCC)    Thyroid disease     Past Surgical History:  Procedure Laterality Date   ABDOMINAL HYSTERECTOMY     CESAREAN SECTION      Family History  Problem Relation Age of Onset   Hearing loss Mother    Diabetes Father    Diabetes Maternal Grandmother    Diabetes Maternal Grandfather    Diabetes Paternal Grandmother    Diabetes Paternal Grandfather    Colon cancer Neg  Hx    Stomach cancer Neg Hx    Esophageal varices Neg Hx     Social History   Socioeconomic History   Marital status: Married    Spouse name: Not on file   Number of children: 3   Years of education: Not on file   Highest education level: Not on file  Occupational History   Occupation: home maker  Tobacco Use   Smoking status: Every Day    Types: Cigarettes   Smokeless tobacco: Never   Tobacco comments:    Smokes 2 cigs a day   Vaping Use   Vaping status: Never Used  Substance and Sexual Activity   Alcohol use: No   Drug use: No   Sexual activity: Yes  Other Topics Concern   Not on file  Social History Narrative   Not on file   Social Drivers of Health   Financial Resource Strain: Not on file  Food Insecurity: Not on file  Transportation Needs: Not on file  Physical Activity: Not on file  Stress: Not on file  Social Connections: Not on file    No Known Allergies  Outpatient Medications Prior to Visit  Medication Sig Dispense Refill   atorvastatin (LIPITOR) 10 MG tablet Take 1 tablet (10 mg total) by mouth daily. 90 tablet 3   levothyroxine (SYNTHROID) 100 MCG tablet Take 1 tablet (100 mcg total) by mouth daily. 90 tablet 1   pantoprazole (PROTONIX) 40 MG tablet Take 1 tablet (40  mg total) by mouth daily. 30 tablet 3   No facility-administered medications prior to visit.     ROS Review of Systems  Constitutional:  Negative for activity change and appetite change.  HENT:  Negative for sinus pressure and sore throat.   Respiratory:  Negative for chest tightness, shortness of breath and wheezing.   Cardiovascular:  Negative for chest pain and palpitations.  Gastrointestinal:  Negative for abdominal distention and constipation.  Genitourinary: Negative.   Musculoskeletal: Negative.   Psychiatric/Behavioral:  Negative for behavioral problems and dysphoric mood.     Objective:  BP 105/72   Pulse 75   Ht 5\' 4"  (1.626 m)   Wt 215 lb 3.2 oz (97.6 kg)    SpO2 99%   BMI 36.94 kg/m      11/25/2023   10:23 AM 07/25/2023   10:06 AM 06/13/2023   10:17 AM  BP/Weight  Systolic BP 105 102 92  Diastolic BP 72 69 70  Wt. (Lbs) 215.2 199.8 204.4  BMI 36.94 kg/m2 34.3 kg/m2 35.09 kg/m2      Physical Exam Constitutional:      Appearance: She is well-developed.  Cardiovascular:     Rate and Rhythm: Normal rate.     Heart sounds: Normal heart sounds. No murmur heard. Pulmonary:     Effort: Pulmonary effort is normal.     Breath sounds: Normal breath sounds. No wheezing or rales.  Chest:     Chest wall: No tenderness.  Abdominal:     General: Bowel sounds are normal. There is no distension.     Palpations: Abdomen is soft. There is no mass.     Tenderness: There is no abdominal tenderness.  Musculoskeletal:        General: Normal range of motion.     Right lower leg: No edema.     Left lower leg: No edema.  Neurological:     Mental Status: She is alert and oriented to person, place, and time.  Psychiatric:        Mood and Affect: Mood normal.        Latest Ref Rng & Units 06/13/2023   10:53 AM 11/08/2022    9:53 AM 02/14/2014    1:57 PM  CMP  Glucose 70 - 99 mg/dL 161  90  096   BUN 6 - 24 mg/dL 10  17  13    Creatinine 0.57 - 1.00 mg/dL 0.45  4.09  8.11   Sodium 134 - 144 mmol/L 139  136  139   Potassium 3.5 - 5.2 mmol/L 4.3  3.9  3.8   Chloride 96 - 106 mmol/L 100  101  100   CO2 20 - 29 mmol/L 25  23  24    Calcium 8.7 - 10.2 mg/dL 9.4  9.4  9.3   Total Protein 6.0 - 8.5 g/dL 7.0  7.6  7.9   Total Bilirubin 0.0 - 1.2 mg/dL 0.4  <9.1  0.2   Alkaline Phos 44 - 121 IU/L 97  127  100   AST 0 - 40 IU/L 19  19  21    ALT 0 - 32 IU/L 11  17  23      Lipid Panel     Component Value Date/Time   CHOL 189 02/07/2023 1100   TRIG 156 (H) 02/07/2023 1100   HDL 45 02/07/2023 1100   CHOLHDL 4.2 02/07/2023 1100   LDLCALC 116 (H) 02/07/2023 1100    CBC    Component Value Date/Time  WBC 10.5 06/13/2023 1053   WBC 11.3 (H)  02/14/2014 1357   RBC 4.54 06/13/2023 1053   RBC 4.51 02/14/2014 1357   HGB 12.5 06/13/2023 1053   HCT 37.8 06/13/2023 1053   PLT 370 06/13/2023 1053   MCV 83 06/13/2023 1053   MCH 27.5 06/13/2023 1053   MCH 27.7 02/14/2014 1357   MCHC 33.1 06/13/2023 1053   MCHC 34.0 02/14/2014 1357   RDW 15.5 (H) 06/13/2023 1053   LYMPHSABS 3.8 (H) 06/13/2023 1053   EOSABS 0.5 (H) 06/13/2023 1053   BASOSABS 0.1 06/13/2023 1053    Lab Results  Component Value Date   HGBA1C 6.3 11/25/2023    Lab Results  Component Value Date   TSH 5.610 (H) 07/25/2023    Assessment & Plan:      Abdominal Pain Resolved abdominal pain on the right side associated with bloating. No associated nausea, vomiting, or urinary symptoms. Pain was not related to food intake. Possible differential diagnoses include gallstones or constipation. -If pain recurs, patient to notify the clinic.  Gastroesophageal Reflux Disease (GERD) Patient was previously prescribed medication for acid reflux but is no longer experiencing symptoms. -Discontinue acid reflux medication as symptoms have resolved.  Hypothyroidism Patient is currently on thyroid medication and is due for a refill in about a week. -Last TSH was elevated -Order blood tests to assess thyroid function and adjust medication dosage as necessary. -Contact patient with results and any necessary medication adjustments.  Hyperlipidemia Patient is currently on Atorvastatin for cholesterol management. -Continue Atorvastatin as prescribed.  Constipation Patient reports constipation which may have contributed to previous abdominal pain. -Advise use of a laxative such as Miralax as needed.  Type II Diabetes Mellitus Well-controlled with lifestyle modifications, recent HbA1c of 6.3. -Continue current lifestyle modifications.  General Health Maintenance / Followup Plans -Order eye exam referral due to patient's diabetes. -Order colon cancer screening stool  test. -Follow up in approximately six months.          No orders of the defined types were placed in this encounter.   Follow-up: Return in about 6 months (around 05/24/2024) for Chronic medical conditions.       Hoy Register, MD, FAAFP. [facility] [place], [place] [phone]   11/25/2023, 11:16 AM

## 2023-11-25 NOTE — Patient Instructions (Signed)
Dolor abdominal en los adultos Abdominal Pain, Adult  El dolor en el abdomen (dolor abdominal) puede tener muchas causas. En la [employer], [place] sin tratamiento o al recibir Medical laboratory scientific officer. Pero en [employer], puede ser grave. El mdico le har preguntas sobre sus antecedentes mdicos y le har un examen fsico para tratar de comprender la causa del dolor. Siga estas instrucciones en su casa: Medicamentos [employer] de venta libre y los recetados solamente como se lo haya indicado el mdico. No tome medicamentos que lo ayuden a Advertising copywriter (laxantes), salvo que el mdico se lo indique. Instrucciones generales Controle su afeccin para detectar cualquier cambio. Beber suficiente lquido para Radio producer pis (orina) de color amarillo plido. Comunquese con un mdico si: El dolor cambia, empeora o dura ms de lo previsto. Tiene clicos o distensin abdominal intensos, o vomita. El dolor empeora con las comidas, despus de comer o con determinados alimentos. Est estreido o tiene diarrea durante ms de 2 o 3 das. No tiene apetito o baja de peso sin proponrselo. Tiene signos de deshidratacin. Pueden incluir: [place], muy escasa o falta de [place]. Labios agrietados o Building surveyor. Somnolencia o debilidad. Tiene dolor al hacer pis (orinar) o al defecar. El dolor abdominal lo despierta de noche. Observa sangre en la orina. Tiene fiebre. Solicite ayuda de inmediato si: No puede dejar de vomitar. El dolor solo se localiza en una parte del abdomen. Si se localiza en la zona derecha, posiblemente podra tratarse de apendicitis. Produce materia fecal (heces) con sangre, de color negro, o con aspecto alquitranado. Tiene dificultad para respirar. Tiene dolor en el pecho. Estos sntomas pueden Customer service manager. Solicite ayuda de inmediato. Llame al 911. No espere a ver si los sntomas desaparecen. No conduzca por sus propios medios [employer]. Esta  informacin no tiene Theme park manager el consejo del mdico. Asegrese de hacerle al mdico cualquier pregunta que tenga. Document Revised: 11/24/2022 Document Reviewed: 11/24/2022 Elsevier Patient Education  2024 [employer].

## 2023-11-27 ENCOUNTER — Other Ambulatory Visit: Payer: Self-pay

## 2023-11-27 ENCOUNTER — Other Ambulatory Visit: Payer: Self-pay | Admitting: Family Medicine

## 2023-11-27 LAB — CMP14+EGFR
ALT: 11 [IU]/L (ref 0–32)
AST: 16 [IU]/L (ref 0–40)
Albumin: 4.1 g/dL (ref 3.8–4.9)
Alkaline Phosphatase: 129 [IU]/L — ABNORMAL HIGH (ref 44–121)
BUN/Creatinine Ratio: 20 (ref 9–23)
BUN: 15 mg/dL (ref 6–24)
Bilirubin Total: 0.3 mg/dL (ref 0.0–1.2)
CO2: 23 mmol/L (ref 20–29)
Calcium: 9.6 mg/dL (ref 8.7–10.2)
Chloride: 101 mmol/L (ref 96–106)
Creatinine, Ser: 0.75 mg/dL (ref 0.57–1.00)
Globulin, Total: 3.4 g/dL (ref 1.5–4.5)
Glucose: 100 mg/dL — ABNORMAL HIGH (ref 70–99)
Potassium: 4.5 mmol/L (ref 3.5–5.2)
Sodium: 137 mmol/L (ref 134–144)
Total Protein: 7.5 g/dL (ref 6.0–8.5)
eGFR: 93 mL/min/{1.73_m2} (ref >59)

## 2023-11-27 LAB — T4, FREE: Free T4: 1.36 ng/dL (ref 0.82–1.77)

## 2023-11-27 LAB — MICROALBUMIN / CREATININE URINE RATIO
Creatinine, Urine: 270.8 mg/dL
Microalb/Creat Ratio: 7 mg/g{creat} (ref 0–29)
Microalbumin, Urine: 18.3 ug/mL

## 2023-11-27 LAB — LP+NON-HDL CHOLESTEROL
Cholesterol, Total: 194 mg/dL (ref 100–199)
HDL: 49 mg/dL (ref >39)
LDL Chol Calc (NIH): 121 mg/dL — ABNORMAL HIGH (ref 0–99)
Total Non-HDL-Chol (LDL+VLDL): 145 mg/dL — ABNORMAL HIGH (ref 0–129)
Triglycerides: 132 mg/dL (ref 0–149)
VLDL Cholesterol Cal: 24 mg/dL (ref 5–40)

## 2023-11-27 LAB — T3: T3, Total: 119 ng/dL (ref 71–180)

## 2023-11-27 LAB — TSH: TSH: 3.94 u[IU]/mL (ref 0.450–4.500)

## 2023-11-27 MED ORDER — ATORVASTATIN CALCIUM 20 MG PO TABS
20.0000 mg | ORAL_TABLET | Freq: Every day | ORAL | 1 refills | Status: DC
  Filled 2023-11-27: qty 90, 90d supply, fill #0

## 2023-11-29 ENCOUNTER — Other Ambulatory Visit: Payer: Self-pay | Admitting: Critical Care Medicine

## 2023-11-29 ENCOUNTER — Other Ambulatory Visit: Payer: Self-pay

## 2023-11-29 MED ORDER — LEVOTHYROXINE SODIUM 100 MCG PO TABS
100.0000 ug | ORAL_TABLET | Freq: Every day | ORAL | 1 refills | Status: DC
  Filled 2023-11-29: qty 90, 90d supply, fill #0
  Filled 2024-03-12: qty 90, 90d supply, fill #1

## 2023-11-29 NOTE — Telephone Encounter (Signed)
Requested Prescriptions  Pending Prescriptions Disp Refills   levothyroxine (SYNTHROID) 100 MCG tablet 90 tablet 1    Sig: Take 1 tablet (100 mcg total) by mouth daily.     Endocrinology:  Hypothyroid Agents Passed - 11/29/2023  4:04 PM      Passed - TSH in normal range and within 360 days    TSH  Date Value Ref Range Status  11/25/2023 3.940 0.450 - 4.500 uIU/mL Final         Passed - Valid encounter within last 12 months    Recent Outpatient Visits           4 days ago Type 2 diabetes mellitus without complication, without long-term current use of insulin (HCC)   [facility] Of [place]. [facility] Hoy Register, MD   4 months ago Emesis, persistent   [facility] - [facility] Dept Of [place]. [facility] Storm Frisk, MD   5 months ago Emesis, persistent   [facility] - [facility] Dept Of [place]. [facility] Storm Frisk, MD   9 months ago Type 2 diabetes mellitus with hyperglycemia, without long-term current use of insulin [facility])   [facility] - [facility] Dept Of [place]. [facility] Storm Frisk, MD   10 months ago Acute cough   [facility] Grayce Sessions, NP       Future Appointments             In 5 months Hoy Register, MD [facility] - A Dept Of [place]igha Br[place]. [facility]

## 2023-12-01 LAB — FECAL OCCULT BLOOD, IMMUNOCHEMICAL: Fecal Occult Bld: POSITIVE — AB

## 2023-12-02 ENCOUNTER — Other Ambulatory Visit: Payer: Self-pay | Admitting: Family Medicine

## 2023-12-02 DIAGNOSIS — R195 Other fecal abnormalities: Secondary | ICD-10-CM

## 2023-12-03 ENCOUNTER — Encounter: Payer: Self-pay | Admitting: Family Medicine

## 2023-12-04 ENCOUNTER — Other Ambulatory Visit: Payer: Self-pay

## 2023-12-31 ENCOUNTER — Other Ambulatory Visit (HOSPITAL_COMMUNITY): Payer: Self-pay

## 2024-01-29 ENCOUNTER — Ambulatory Visit: Payer: Self-pay | Admitting: Physician Assistant

## 2024-03-05 ENCOUNTER — Other Ambulatory Visit: Payer: Self-pay

## 2024-03-12 ENCOUNTER — Other Ambulatory Visit: Payer: Self-pay

## 2024-03-13 ENCOUNTER — Other Ambulatory Visit: Payer: Self-pay

## 2024-04-02 ENCOUNTER — Encounter: Payer: Self-pay | Admitting: Physician Assistant

## 2024-04-02 ENCOUNTER — Ambulatory Visit (INDEPENDENT_AMBULATORY_CARE_PROVIDER_SITE_OTHER): Payer: Self-pay | Admitting: Physician Assistant

## 2024-04-02 ENCOUNTER — Other Ambulatory Visit: Payer: Self-pay

## 2024-04-02 VITALS — BP 120/68 | HR 75 | Ht 64.0 in | Wt 218.0 lb

## 2024-04-02 DIAGNOSIS — R195 Other fecal abnormalities: Secondary | ICD-10-CM

## 2024-04-02 DIAGNOSIS — K5900 Constipation, unspecified: Secondary | ICD-10-CM

## 2024-04-02 DIAGNOSIS — K5909 Other constipation: Secondary | ICD-10-CM

## 2024-04-02 MED ORDER — NA SULFATE-K SULFATE-MG SULF 17.5-3.13-1.6 GM/177ML PO SOLN
1.0000 | Freq: Once | ORAL | 0 refills | Status: AC
  Filled 2024-04-02: qty 354, 1d supply, fill #0

## 2024-04-02 NOTE — Progress Notes (Signed)
 ____________________________________________________________  Attending physician addendum:  Thank you for sending this case to me. I have reviewed the entire note and agree with the plan.   Amada Jupiter, MD  ____________________________________________________________

## 2024-04-02 NOTE — Patient Instructions (Addendum)
 Su proveedor le ha pedido que baje al stano para anlisis de laboratorio antes de irse hoy. Presione "B" en el ascensor. El laboratorio se encuentra en la primera puerta a la izquierda al salir del Medical sales representative.  Tome Miralax con 1 tapn al da en 237 ml (8 onzas) de lquido.  Le han programado una colonoscopia. Siga las instrucciones escritas que le dieron en su visita de hoy.  Si usa  inhaladores (aunque solo sean necesarios), Armed forces operational officer del procedimiento.  NO TOMAR 7 DAS ANTES DE LA PRUEBA: Trulicity (dulaglutida) Ozempic, Wegovy (semaglutida) Mounjaro (tirzepatida) Bydureon Bcise (exanatida de liberacin prolongada)  NO TOMAR 1 DA ANTES DE LA PRUEBA: Rybelsus (semaglutida) Adlyxin (lixisenatida) Victoza (liraglutida) Byetta (exanatida)  ______________________________________________________________  Your provider has requested that you go to the basement level for lab work before leaving today. Press "B" on the elevator. The lab is located at the first door on the left as you exit the elevator.  Start Miralax 1 capful daily in 8 ounces of liquid.   You have been scheduled for a colonoscopy. Please follow written instructions given to you at your visit today.   If you use inhalers (even only as needed), please bring them with you on the day of your procedure.  DO NOT TAKE 7 DAYS PRIOR TO TEST- Trulicity (dulaglutide) Ozempic, Wegovy (semaglutide) Mounjaro (tirzepatide) Bydureon Bcise (exanatide extended release)  DO NOT TAKE 1 DAY PRIOR TO YOUR TEST Rybelsus (semaglutide) Adlyxin (lixisenatide) Victoza (liraglutide) Byetta (exanatide) _________________________________________________________________

## 2024-04-02 NOTE — Progress Notes (Signed)
 Chief Complaint: Blood in stool  HPI:    Bethany Vance is a 58 year old Hispanic speaking female known to Dr. Dominic Friendly, who presents to clinic today with an interpreter, with a past medical history as listed below including stroke and diabetes, who was referred to me by Joaquin Mulberry, MD for a complaint of blood in stool.      02/28/2023 upper endoscopy with normal appearance.  Mucosal changes in the stomach.  Pathology showed no H. pylori infection.  CT ordered.    03/21/2023 CTAP for unintentional weight loss and nausea and vomiting no acute findings.    04/19/23 gastric emptying study normal.  At that time recommended upper endoscopy to take deeper tissue specimens in the stomach wall.    11/25/2023 patient followed with PCP at that time followed up for abdominal pain that had started 2 weeks prior with pain in the right upper quadrant and bloating.  The pain had resolved.  Patient's acid reflux medicine was discontinued.  She did report constipation and recommended MiraLAX.    11/25/2023 CMP with a glucose of 100 and otherwise normal.  Normal T4 and TSH and T3.    11/29/23 fecal occult positive.    Today, patient presents to clinic and explains that she was told she had blood in her stool.  Otherwise describes occasional days of constipation sometimes going 3 to 4 days in between a bowel movement for which she drinks a tea that helps her go.  When she is constipated she feels some pain on the right side of her abdomen which goes away after she is able to have a bowel movement.  She has never had a colonoscopy before.    Nuys fever, chills, weight loss or seeing any blood in her stool.  Past Medical History:  Diagnosis Date   Diabetes mellitus without complication (HCC)    Hyperlipidemia associated with type 2 diabetes mellitus (HCC) 02/07/2023   Stroke (HCC)    Thyroid  disease     Past Surgical History:  Procedure Laterality Date   ABDOMINAL HYSTERECTOMY     CESAREAN SECTION      Current  Outpatient Medications  Medication Sig Dispense Refill   atorvastatin  (LIPITOR) 20 MG tablet Take 1 tablet (20 mg total) by mouth daily. 90 tablet 1   levothyroxine  (SYNTHROID ) 100 MCG tablet Take 1 tablet (100 mcg total) by mouth daily. 90 tablet 1   pantoprazole  (PROTONIX ) 40 MG tablet Take 1 tablet (40 mg total) by mouth daily. 30 tablet 3   No current facility-administered medications for this visit.    Allergies as of 04/02/2024   (No Known Allergies)    Family History  Problem Relation Age of Onset   Hearing loss Mother    Diabetes Father    Diabetes Maternal Grandmother    Diabetes Maternal Grandfather    Diabetes Paternal Grandmother    Diabetes Paternal Grandfather    Colon cancer Neg Hx    Stomach cancer Neg Hx    Esophageal varices Neg Hx     Social History   Socioeconomic History   Marital status: Married    Spouse name: Not on file   Number of children: 3   Years of education: Not on file   Highest education level: Not on file  Occupational History   Occupation: home maker  Tobacco Use   Smoking status: Every Day    Types: Cigarettes   Smokeless tobacco: Never   Tobacco comments:    Smokes 2 cigs a  day   Vaping Use   Vaping status: Never Used  Substance and Sexual Activity   Alcohol use: No   Drug use: No   Sexual activity: Yes  Other Topics Concern   Not on file  Social History Narrative   Not on file   Social Drivers of Health   Financial Resource Strain: Not on file  Food Insecurity: Not on file  Transportation Needs: Not on file  Physical Activity: Not on file  Stress: Not on file  Social Connections: Not on file  Intimate Partner Violence: Not on file    Review of Systems:    Constitutional: No weight loss, fever or chills Skin: No rash  Cardiovascular: No chest pain Respiratory: No SOB Gastrointestinal: See HPI and otherwise negative Genitourinary: No dysuria Neurological: No headache, dizziness or syncope Musculoskeletal: No  new muscle or joint pain Hematologic: No  bruising Psychiatric: No history of depression or anxiety   Physical Exam:  Vital signs: BP 120/68   Pulse 75   Ht 5\' 4"  (1.626 m)   Wt 218 lb (98.9 kg)   BMI 37.42 kg/m    Constitutional:   Pleasant Hispanic female appears to be in NAD, Well developed, Well nourished, alert and cooperative Head:  Normocephalic and atraumatic. Eyes:   PEERL, EOMI. No icterus. Conjunctiva pink. Ears:  Normal auditory acuity. Neck:  Supple Throat: Oral cavity and pharynx without inflammation, swelling or lesion.  Respiratory: Respirations even and unlabored. Lungs clear to auscultation bilaterally.   No wheezes, crackles, or rhonchi.  Cardiovascular: Normal S1, S2. No MRG. Regular rate and rhythm. No peripheral edema, cyanosis or pallor.  Gastrointestinal:  Soft, nondistended, nontender. No rebound or guarding. Normal bowel sounds. No appreciable masses or hepatomegaly. Rectal:  Not performed.  Msk:  Symmetrical without gross deformities. Without edema, no deformity or joint abnormality.  Neurologic:  Alert and  oriented x4;  grossly normal neurologically.  Skin:   Dry and intact without significant lesions or rashes. Psychiatric:  Demonstrates good judgement and reason without abnormal affect or behaviors.  RELEVANT LABS AND IMAGING: CBC    Component Value Date/Time   WBC 10.5 06/13/2023 1053   WBC 11.3 (H) 02/14/2014 1357   RBC 4.54 06/13/2023 1053   RBC 4.51 02/14/2014 1357   HGB 12.5 06/13/2023 1053   HCT 37.8 06/13/2023 1053   PLT 370 06/13/2023 1053   MCV 83 06/13/2023 1053   MCH 27.5 06/13/2023 1053   MCH 27.7 02/14/2014 1357   MCHC 33.1 06/13/2023 1053   MCHC 34.0 02/14/2014 1357   RDW 15.5 (H) 06/13/2023 1053   LYMPHSABS 3.8 (H) 06/13/2023 1053   EOSABS 0.5 (H) 06/13/2023 1053   BASOSABS 0.1 06/13/2023 1053    CMP     Component Value Date/Time   NA 137 11/25/2023 1110   K 4.5 11/25/2023 1110   CL 101 11/25/2023 1110   CO2 23  11/25/2023 1110   GLUCOSE 100 (H) 11/25/2023 1110   GLUCOSE 106 (H) 02/14/2014 1357   BUN 15 11/25/2023 1110   CREATININE 0.75 11/25/2023 1110   CALCIUM  9.6 11/25/2023 1110   PROT 7.5 11/25/2023 1110   ALBUMIN 4.1 11/25/2023 1110   AST 16 11/25/2023 1110   ALT 11 11/25/2023 1110   ALKPHOS 129 (H) 11/25/2023 1110   BILITOT 0.3 11/25/2023 1110   GFRNONAA >90 02/14/2014 1357   GFRAA >90 02/14/2014 1357    Assessment: 1.  Occult positive stool: Found by PCP, no correlating CBC, ordering today, patient has  not seen any blood in her stool, no prior colonoscopy; consider polyp versus hemorrhoids versus other 2.  Chronic constipation: Per patient uses an herbal tea every few days in order to go but does have some right sided abdominal discomfort when she is constipated and this happens chronically; consider IBS-SIBO versus low transit versus diet versus other  Plan: 1.  Scheduled patient for diagnostic colonoscopy in the [facility] with Dr. Dominic Friendly.  Did provide the patient a detailed list of risks for the procedure and she agrees to proceed. Patient is appropriate for endoscopic procedure(s) in the ambulatory ([facility]) setting.  2.  Patient will have a 2-day bowel prep given history of constipation 3.  Recommend the patient start MiraLAX on a daily basis 4.  Ordered labs today to include a CBC and iron studies with ferritin 5.  Patient to follow in clinic per recommendations after time of colonoscopy.    Reginal Capra, PA-C [facility] 04/02/2024, 10:31 AM  Cc: Joaquin Mulberry, MD

## 2024-04-22 ENCOUNTER — Other Ambulatory Visit: Payer: Self-pay

## 2024-04-22 ENCOUNTER — Encounter: Payer: Self-pay | Admitting: Gastroenterology

## 2024-04-22 ENCOUNTER — Other Ambulatory Visit (INDEPENDENT_AMBULATORY_CARE_PROVIDER_SITE_OTHER): Payer: Self-pay

## 2024-04-22 ENCOUNTER — Ambulatory Visit: Payer: Self-pay | Admitting: Gastroenterology

## 2024-04-22 VITALS — BP 74/44 | HR 61 | Temp 97.5°F | Resp 10 | Ht 64.0 in | Wt 218.0 lb

## 2024-04-22 DIAGNOSIS — Z1211 Encounter for screening for malignant neoplasm of colon: Secondary | ICD-10-CM

## 2024-04-22 DIAGNOSIS — K5730 Diverticulosis of large intestine without perforation or abscess without bleeding: Secondary | ICD-10-CM

## 2024-04-22 DIAGNOSIS — D125 Benign neoplasm of sigmoid colon: Secondary | ICD-10-CM

## 2024-04-22 DIAGNOSIS — R195 Other fecal abnormalities: Secondary | ICD-10-CM

## 2024-04-22 DIAGNOSIS — K648 Other hemorrhoids: Secondary | ICD-10-CM

## 2024-04-22 LAB — CBC WITH DIFFERENTIAL/PLATELET
Basophils Absolute: 0 10*3/uL (ref 0.0–0.1)
Basophils Relative: 0.5 % (ref 0.0–3.0)
Eosinophils Absolute: 0.1 10*3/uL (ref 0.0–0.7)
Eosinophils Relative: 1.4 % (ref 0.0–5.0)
HCT: 33.8 % — ABNORMAL LOW (ref 36.0–46.0)
Hemoglobin: 10.9 g/dL — ABNORMAL LOW (ref 12.0–15.0)
Lymphocytes Relative: 33.1 % (ref 12.0–46.0)
Lymphs Abs: 3.4 10*3/uL (ref 0.7–4.0)
MCHC: 32.3 g/dL (ref 30.0–36.0)
MCV: 72.3 fl — ABNORMAL LOW (ref 78.0–100.0)
Monocytes Absolute: 0.7 10*3/uL (ref 0.1–1.0)
Monocytes Relative: 6.7 % (ref 3.0–12.0)
Neutro Abs: 6 10*3/uL (ref 1.4–7.7)
Neutrophils Relative %: 58.3 % (ref 43.0–77.0)
Platelets: 358 10*3/uL (ref 150.0–400.0)
RBC: 4.68 Mil/uL (ref 3.87–5.11)
RDW: 16.2 % — ABNORMAL HIGH (ref 11.5–15.5)
WBC: 10.3 10*3/uL (ref 4.0–10.5)

## 2024-04-22 MED ORDER — PANTOPRAZOLE SODIUM 40 MG PO TBEC
40.0000 mg | DELAYED_RELEASE_TABLET | Freq: Every day | ORAL | 3 refills | Status: AC
  Filled 2024-04-22: qty 90, 90d supply, fill #0

## 2024-04-22 MED ORDER — SODIUM CHLORIDE 0.9 % IV SOLN
500.0000 mL | INTRAVENOUS | Status: AC
Start: 2024-04-22 — End: 2024-04-23

## 2024-04-22 NOTE — Progress Notes (Signed)
 Interpreter used today at the [facility] for this pt.  Interpreter's name is- Interior and spatial designer

## 2024-04-22 NOTE — Progress Notes (Signed)
 Vss nad trans to pacu

## 2024-04-22 NOTE — Progress Notes (Signed)
 Called to room to assist during endoscopic procedure.  Patient ID and intended procedure confirmed with present staff. Received instructions for my participation in the procedure from the performing physician.

## 2024-04-22 NOTE — Progress Notes (Signed)
 No significant changes to clinical history since GI office visit on 04/02/24. CBC was planned at that clinic visit but there are no results, so most likely not drawn.  Spanish interpreter present for entire pre-procedure evaluation.  The patient is appropriate for an endoscopic procedure in the ambulatory setting.  - Victory Brand, MD

## 2024-04-22 NOTE — Op Note (Signed)
 [facility] Patient Name: Bethany Vance Procedure Date: 04/22/2024 12:05 PM MRN: [id number] Endoscopist: Victory L. Legrand , MD, [id number] Age: 58 Referring MD:  Date of Birth: [birth year] Gender: Female Account #: [id number] Procedure:                Colonoscopy Indications:              Heme positive stool Medicines:                Monitored Anesthesia Care Procedure:                Pre-Anesthesia Assessment:                           - Prior to the procedure, a History and Physical                            was performed, and patient medications and                            allergies were reviewed. The patient's tolerance of                            previous anesthesia was also reviewed. The risks                            and benefits of the procedure and the sedation                            options and risks were discussed with the patient.                            All questions were answered, and informed consent                            was obtained. Prior Anticoagulants: The patient has                            taken no anticoagulant or antiplatelet agents. ASA                            Grade Assessment: III - A patient with severe                            systemic disease. After reviewing the risks and                            benefits, the patient was deemed in satisfactory                            condition to undergo the procedure.                           After obtaining informed consent, the colonoscope  was passed under direct vision. Throughout the                            procedure, the patient's blood pressure, pulse, and                            oxygen saturations were monitored continuously. The                            CF [id number] #[id number] was introduced through the anus                            and advanced to the the cecum, identified by                            appendiceal orifice and  ileocecal valve. The                            colonoscopy was performed without difficulty. The                            patient tolerated the procedure well. The quality                            of the bowel preparation was excellent. The                            ileocecal valve, appendiceal orifice, and rectum                            were photographed. The bowel preparation used was 2                            day Suprep/Miralax. Scope In: 12:15:51 PM Scope Out: 12:32:31 PM Scope Withdrawal Time: 0 hours 14 minutes 45 seconds  Total Procedure Duration: 0 hours 16 minutes 40 seconds  Findings:                 The perianal and digital rectal examinations were                            normal.                           Repeat examination of right colon under NBI                            performed.                           A few diverticula were found in the right colon.                           Two sessile polyps were found in the sigmoid colon.  The polyps were 4 to 6 mm in size. These polyps                            were removed with a cold snare. Resection and                            retrieval were complete.                           An 8 mm polyp was found in the sigmoid colon. The                            polyp was pedunculated. The polyp was removed with                            a hot snare. Resection and retrieval were complete.                           Internal hemorrhoids were found. The hemorrhoids                            were small.                           The exam was otherwise without abnormality on                            direct and retroflexion views. Complications:            No immediate complications. Estimated Blood Loss:     Estimated blood loss was minimal. Impression:               - Diverticulosis in the right colon.                           - Two 4 to 6 mm polyps in the sigmoid colon,                             removed with a cold snare. Resected and retrieved.                           - One 8 mm polyp in the sigmoid colon, removed with                            a hot snare. Resected and retrieved.                           - Internal hemorrhoids.                           - The examination was otherwise normal on direct                            and retroflexion views. Recommendation:           -  Patient has a contact number available for                            emergencies. The signs and symptoms of potential                            delayed complications were discussed with the                            patient. Return to normal activities tomorrow.                            Written discharge instructions were provided to the                            patient.                           - Resume previous diet.                           - Continue present medications.                           - Await pathology results.                           - Repeat colonoscopy is recommended for                            surveillance. The colonoscopy date will be                            determined after pathology results from today's                            exam become available for review.                           - Go to our lab for a CBC today (was not drawn at                            recent clinic visit) Victory L. Legrand, MD 04/22/2024 12:37:28 PM This report has been signed electronically.

## 2024-04-22 NOTE — Patient Instructions (Signed)
 YOU HAD AN ENDOSCOPIC PROCEDURE TODAY AT THE [facility]:   Refer to the procedure report that was given to you for any specific questions about what was found during the examination.  If the procedure report does not answer your questions, please call your gastroenterologist to clarify.  If you requested that your care partner not be given the details of your procedure findings, then the procedure report has been included in a sealed envelope for you to review at your convenience later.  YOU SHOULD EXPECT: Some feelings of bloating in the abdomen. Passage of more gas than usual.  Walking can help get rid of the air that was put into your GI tract during the procedure and reduce the bloating. If you had a lower endoscopy (such as a colonoscopy or flexible sigmoidoscopy) you may notice spotting of blood in your stool or on the toilet paper. If you underwent a bowel prep for your procedure, you may not have a normal bowel movement for a few days.  Please Note:  You might notice some irritation and congestion in your nose or some drainage.  This is from the oxygen used during your procedure.  There is no need for concern and it should clear up in a day or so.  SYMPTOMS TO REPORT IMMEDIATELY:  Following lower endoscopy (colonoscopy or flexible sigmoidoscopy):  Excessive amounts of blood in the stool  Significant tenderness or worsening of abdominal pains  Swelling of the abdomen that is new, acute  Fever of 100F or higher  For urgent or emergent issues, a gastroenterologist can be reached at any hour by calling [phone]. Do not use MyChart messaging for urgent concerns.    DIET:  We do recommend a small meal at first, but then you may proceed to your regular diet.  Drink plenty of fluids but you should avoid alcoholic beverages for 24 hours.  MEDICATIONS: Continue present medications. A refill for your Protonix  has been sent to your pharmacy of choice.  FOLLOW UP: Await  pathology results. Repeat colonoscopy is recommended for surveillance. The colonoscopy date will be determined after pathology results from today's exam become available for review. Go to the lab prior to leaving today to have a CBC drawn.  Please see handouts given to you by your recovery nurse: Polyps, Diverticulosis, Hemorrhoids.  Thank you for allowing us  to provide for your healthcare needs today.  ACTIVITY:  You should plan to take it easy for the rest of today and you should NOT DRIVE or use heavy machinery until tomorrow (because of the sedation medicines used during the test).    FOLLOW UP: Our staff will call the number listed on your records the next business day following your procedure.  We will call around 7:15- 8:00 am to check on you and address any questions or concerns that you may have regarding the information given to you following your procedure. If we do not reach you, we will leave a message.     If any biopsies were taken you will be contacted by phone or by letter within the next 1-3 weeks.  Please call us  at [phone] if you have not heard about the biopsies in 3 weeks.    SIGNATURES/CONFIDENTIALITY: You and/or your care partner have signed paperwork which will be entered into your electronic medical record.  These signatures attest to the fact that that the information above on your After Visit Summary has been reviewed and is understood.  Full responsibility of the  confidentiality of this discharge information lies with you and/or your care-partner.  USTED TUVO UN PROCEDIMIENTO ENDOSCPICO HOY EN EL [facility]:   Lea el informe del procedimiento que se le entreg para cualquier pregunta especfica sobre lo que se Dentist.  Si el informe del examen no responde a sus preguntas, por favor llame a su gastroenterlogo para aclararlo.  Si usted solicit que no se le den [employer] de lo que se Clinical cytogeneticist en su procedimiento al [employer] va a cuidar, entonces el informe del procedimiento se ha incluido en un sobre sellado para que usted lo revise despus cuando le sea ms conveniente.   LO QUE PUEDE ESPERAR: Algunas sensaciones de hinchazn en el abdomen.  Puede tener ms gases de lo normal.  El caminar puede ayudarle a eliminar el aire que se le puso en el tracto gastrointestinal durante el procedimiento y reducir la hinchazn.  Si le hicieron una endoscopia inferior (como una colonoscopia o una sigmoidoscopia flexible), podra notar manchas de sangre en las heces fecales o en el papel higinico.  Si se someti a una preparacin intestinal para su procedimiento, es posible que no tenga una evacuacin intestinal normal durante [employer].   Tenga en cuenta:  Es posible que note un poco de irritacin y congestin en la nariz o algn drenaje.  Esto es debido al oxgeno [employer] durante su procedimiento.  No hay que preocuparse y esto debe desaparecer ms o Regulatory affairs officer.   SNTOMAS PARA REPORTAR INMEDIATAMENTE:  Despus de una endoscopia inferior (colonoscopia o sigmoidoscopia flexible):  Cantidades excesivas de sangre en las heces fecales  Sensibilidad significativa o empeoramiento de los dolores abdominales   Hinchazn aguda del abdomen que antes no tena   Fiebre de 100F o ms   Despus de la endoscopia superior (EGD)  Vmitos de Retail buyer o material como caf molido   Dolor en el pecho o dolor debajo de los omplatos que antes no tena   Dolor o dificultad persistente para tragar  Falta de aire que antes no tena   Fiebre de 100F o ms  Heces fecales negras y pegajosas   Para asuntos urgentes o de Associate Professor, puede comunicarse con un gastroenterlogo a cualquier hora llamando al [phone].  DIETA:  Recomendamos una comida pequea al principio, pero luego puede continuar con su dieta normal.  Tome muchos lquidos, pero debe evitar las bebidas alcohlicas durante 24 horas.    ACTIVIDAD:  Debe planear tomarse las  cosas con calma por el resto del da y no debe CONDUCIR ni usar maquinaria pesada Patent examiner (debido a los medicamentos de sedacin utilizados durante el examen).     SEGUIMIENTO: Nuestro personal llamar al nmero que aparece en su historial al siguiente da hbil de su procedimiento para ver cmo se siente y para responder cualquier pregunta o inquietud que pueda tener con respecto a la informacin que se le dio despus del procedimiento. Si no podemos contactarle, le dejaremos un mensaje.  Sin embargo, si se siente bien y no tiene English as a second language teacher, no es necesario que nos devuelva la llamada.  Asumiremos que ha regresado a sus actividades diarias normales sin incidentes. Si se le tomaron algunas biopsias, le contactaremos por telfono o por carta en las prximas 3 semanas.  Si no ha sabido [employer] biopsias en el transcurso de 3 semanas, por favor llmenos al [phone].   FIRMAS/CONFIDENCIALIDAD: Usted y/o el acompaante que le cuide han firmado documentos que  se ingresarn en su historial mdico electrnico.  Estas firmas atestiguan el hecho de que la informacin anterior

## 2024-04-23 ENCOUNTER — Ambulatory Visit: Payer: Self-pay | Admitting: Gastroenterology

## 2024-04-23 ENCOUNTER — Telehealth: Payer: Self-pay

## 2024-04-23 ENCOUNTER — Other Ambulatory Visit: Payer: Self-pay

## 2024-04-23 DIAGNOSIS — D509 Iron deficiency anemia, unspecified: Secondary | ICD-10-CM

## 2024-04-23 NOTE — Telephone Encounter (Signed)
  Follow up Call-     04/22/2024   10:58 AM 02/28/2023    9:36 AM  Call back number  Post procedure Call Back phone  # Montie (daughter) [phone]  Permission to leave phone message Yes Yes     Patient questions:  Do you have a fever, pain , or abdominal swelling? No. Pain Score  0 *  Have you tolerated food without any problems? Yes.    Have you been able to return to your normal activities? Yes.    Do you have any questions about your discharge instructions: Diet   No. Medications  No. Follow up visit  No.  Do you have questions or concerns about your Care? No.  Actions: * If pain score is 4 or above: No action needed, pain <4.

## 2024-04-24 LAB — SURGICAL PATHOLOGY

## 2024-05-06 ENCOUNTER — Ambulatory Visit: Payer: Self-pay | Admitting: Gastroenterology

## 2024-05-22 ENCOUNTER — Telehealth: Payer: Self-pay | Admitting: Family Medicine

## 2024-05-22 NOTE — Telephone Encounter (Signed)
 Pt unconfirmed appt phone not in service

## 2024-05-25 ENCOUNTER — Other Ambulatory Visit: Payer: Self-pay

## 2024-05-25 ENCOUNTER — Ambulatory Visit: Payer: Self-pay | Attending: Family Medicine | Admitting: Family Medicine

## 2024-05-25 ENCOUNTER — Encounter: Payer: Self-pay | Admitting: Family Medicine

## 2024-05-25 VITALS — BP 105/77 | HR 77 | Wt 220.2 lb

## 2024-05-25 DIAGNOSIS — F17210 Nicotine dependence, cigarettes, uncomplicated: Secondary | ICD-10-CM | POA: Insufficient documentation

## 2024-05-25 DIAGNOSIS — E119 Type 2 diabetes mellitus without complications: Secondary | ICD-10-CM

## 2024-05-25 DIAGNOSIS — E785 Hyperlipidemia, unspecified: Secondary | ICD-10-CM

## 2024-05-25 DIAGNOSIS — E039 Hypothyroidism, unspecified: Secondary | ICD-10-CM

## 2024-05-25 DIAGNOSIS — E1169 Type 2 diabetes mellitus with other specified complication: Secondary | ICD-10-CM

## 2024-05-25 LAB — POCT GLYCOSYLATED HEMOGLOBIN (HGB A1C): HbA1c, POC (controlled diabetic range): 6.1 % (ref 0.0–7.0)

## 2024-05-25 LAB — GLUCOSE, POCT (MANUAL RESULT ENTRY): POC Glucose: 117 mg/dL — AB (ref 70–99)

## 2024-05-25 MED ORDER — BUPROPION HCL ER (XL) 150 MG PO TB24
150.0000 mg | ORAL_TABLET | Freq: Every day | ORAL | 1 refills | Status: AC
  Filled 2024-05-25: qty 90, 90d supply, fill #0

## 2024-05-25 MED ORDER — ATORVASTATIN CALCIUM 20 MG PO TABS
20.0000 mg | ORAL_TABLET | Freq: Every day | ORAL | 1 refills | Status: AC
  Filled 2024-05-25: qty 90, 90d supply, fill #0

## 2024-05-25 NOTE — Patient Instructions (Signed)
Dislipidemia Dyslipidemia La dislipidemia es un desequilibrio de sustancias cerosas parecidas a la grasa (lpidos) en la sangre. El cuerpo necesita lpidos en pequeas cantidades. Con frecuencia, la dislipidemia implica un nivel alto de colesterol o triglicridos, que son tipos de lpidos. Las formas frecuentes de dislipidemia incluyen las siguientes: Niveles elevados de colesterol LDL. El LDL es el tipo de colesterol que causa la acumulacin de depsitos de grasa (placas) en los vasos sanguneos que transportan la sangre fuera del corazn (arterias). Niveles bajos de colesterol HDL. El HDL es el tipo de colesterol que brinda proteccin contra las enfermedades cardacas. Los niveles altos de HDL eliminan la acumulacin de LDL de las arterias. Niveles altos de triglicridos. Los triglicridos son una sustancia grasa presente en la sangre que se relaciona con la acumulacin de placa en las arterias. Cules son las causas? Hay dos tipos principales de dislipidemia: primaria y secundaria. La dislipidemia primaria es causada por cambios (mutaciones) en los genes que se transmiten a travs de las familias (se heredan). Estas mutaciones causan varios tipos de dislipidemia. La dislipidemia secundaria puede ser causada por diversos factores de riesgo que pueden provocar la enfermedad, como las opciones de estilo de vida y ciertas enfermedades. Qu incrementa el riesgo? Tiene ms probabilidades de desarrollar esta afeccin si es un hombre mayor o si es una mujer que ha pasado por la menopausia. Otros factores de riesgo son los siguientes: Tener antecedentes familiares de dislipidemia. Tomar determinados medicamentos, entre ellos, pldoras anticonceptivas, corticoesteroides, algunos diurticos y betabloqueantes. Seguir una dieta con alto contenido de grasas saturadas. Fumar cigarrillos o beber alcohol en exceso. Tener ciertas afecciones mdicas, como diabetes, sndrome de ovario poliqustico (SOP), enfermedad  renal, enfermedad heptica o hipotiroidismo. No hacer ejercicio regularmente. Tener sobrepeso o ser obeso con demasiada grasa en el abdomen. Cules son los signos o sntomas? En la mayora de los casos, la dislipidemia no causa ningn sntoma. En los casos graves, los niveles muy altos de lpidos pueden causar: Protuberancias de grasa debajo de la piel (xantomas). Un anillo blanco o gris alrededor del centro negro (pupila) del ojo. Los niveles muy altos de triglicridos pueden causar inflamacin del pncreas (pancreatitis). Cmo se diagnostica? Su mdico puede diagnosticar dislipidemia basndose en un anlisis de sangre de rutina (anlisis de sangre en ayunas). Como la mayora de las personas no tienen sntomas de la afeccin, este anlisis de sangre (perfil de lpidos) se realiza en adultos mayores de 20 aos y se repite cada 4 a 6 aos. En este anlisis, se controla lo siguiente: Colesterol total. Esto mide la cantidad total de colesterol en la sangre, que incluye el colesterol LDL, el colesterol HDL y los triglicridos. Un valor saludable est por debajo de 200 mg/dl (5.17 mmol/l). Colesterol LDL. El valor objetivo de colesterol LDL es diferente para cada persona, en funcin de los factores de riesgo individuales. Un valor saludable suele estar por debajo de 100 mg/dl (2.59 mmol/l). Consulte al mdico cul debe ser el valor del colesterol LDL para usted. Colesterol HDL. Un nivel de colesterol HDL de 60 mg/dl (1.55 mmol/l) o superior es lo mejor porque ayuda a proteger contra las enfermedades cardacas. Un valor por debajo de 40 mg/dl (1.03 mmol/l) para los hombres o por debajo de 50 mg/dl (1.29 mmol/l) para las mujeres aumenta el riesgo de enfermedad cardaca. Triglicridos. Un valor de triglicridos saludable est por debajo de 150 mg/dl (1.69 mmol/l). Si su perfil de lpidos es anormal, su mdico puede realizar otros anlisis de sangre. Cmo se trata? El tratamiento   depende del tipo de  dislipidemia que usted tenga y sus otros factores de riesgo de enfermedades cardacas o accidente cerebrovascular. Su mdico tendr un rango objetivo para sus niveles de lpidos en funcin de esta informacin. El tratamiento para la dislipidemia comienza con cambios en el estilo de vida, tales como dieta y ejercicio. El mdico podra recomendarle que haga lo siguiente: Hacer ejercicio con regularidad. Realizar cambios en la dieta. Si fuma, dejar de hacerlo. Limitar el consumo de bebidas alcohlicas. Si los cambios en la dieta y la actividad fsica no ayudan a alcanzar sus objetivos, el mdico tambin puede recetarle medicamentos para disminuir los lpidos. El tipo de medicamento recetado con ms frecuencia disminuye el colesterol LDL (estatinas). Si tiene un nivel alto de triglicridos, su mdico puede recetarle otro tipo de frmaco (fibratos) o un suplemento de aceite de pescado con omega-3, o ambos. Siga estas instrucciones en su casa: Comida y bebida  Siga las indicaciones del mdico o el nutricionista respecto de las restricciones para las comidas o las bebidas. Siga una dieta saludable como se lo haya indicado el mdico. Esto puede ayudarle a alcanzar y mantener un peso saludable, reducir el colesterol LDL y aumentar el colesterol HDL. Puede incluir: Limitar sus caloras, si tiene sobrepeso. Comer ms frutas, verduras, cereales integrales, pescado y carnes magras. Limitar las grasas saturadas, las grasas trans y el colesterol. No beba alcohol si: Su mdico le indica no hacerlo. Est embarazada, puede estar embarazada o est tratando de quedar embarazada. Si bebe alcohol: Limite la cantidad que bebe a lo siguiente: De 0 a 1 medida por da para las mujeres. De 0 a 2 medidas por da para los hombres. Sepa cunta cantidad de alcohol hay en las bebidas que toma. En los [place], una medida equivale a una botella de cerveza de 12 oz (355 ml), un vaso de vino de 5 oz (148 ml) o un vaso de  una bebida alcohlica de alta graduacin de 1 oz (44 ml). Actividad Haga ejercicio con regularidad. Siga un programa de ejercicio y entrenamiento de fuerza tal como se lo haya indicado el mdico. Pregntele al mdico qu actividades son seguras para usted. El mdico puede recomendarle lo siguiente: 30 minutos de actividad aerbica de 4 a 6 das por semana. La caminata a paso ligero es un ejemplo de actividad aerbica. Entrenamiento de fuerza 2 das por semana. Indicaciones generales No consuma ningn producto que contenga nicotina o tabaco. Estos productos incluyen cigarrillos, tabaco para mascar y aparatos de vapeo, como los cigarrillos electrnicos. Si necesita ayuda para dejar de consumir estos productos, consulte al mdico. Use los medicamentos de venta libre y los recetados solamente como se lo haya indicado el mdico. Esto incluye los suplementos. Concurra a todas las visitas de seguimiento. Esto es importante. Comunquese con un mdico si: Tiene dificultad para cumplir con su plan de actividad fsica o su dieta. Le cuesta dejar de fumar o controlar el consumo de alcohol. Resumen Con frecuencia, la dislipidemia implica un nivel alto de colesterol o triglicridos, que son tipos de lpidos. El tratamiento depende del tipo de dislipidemia que usted tenga y sus otros factores de riesgo de enfermedades cardacas o accidente cerebrovascular. El tratamiento para la dislipidemia comienza con cambios en el estilo de vida, tales como dieta y ejercicio. Su mdico puede recetarle medicamentos para disminuir los lpidos. Esta informacin no tiene como fin reemplazar el consejo del mdico. Asegrese de hacerle al mdico cualquier pregunta que tenga. Document Revised: 12/30/2020 Document Reviewed: 12/30/2020 Elsevier Patient   Education  2024 Elsevier Inc.  

## 2024-05-25 NOTE — Progress Notes (Signed)
 Subjective:  Patient ID: Bethany Vance, female    DOB: 1966-09-23  Age: 58 y.o. MRN: [id number]  CC: Medical Management of Chronic Issues     Discussed the use of AI scribe software for clinical note transcription with the patient, who gave verbal consent to proceed.  History of Present Illness Bethany Vance is a 58 year old female with hypothyroidism, type II diabetes and hyperlipidemia who presents for a follow-up visit.  Her diabetes is well-controlled on diet with an A1c improvement from 6.3 to 6.1. She is managing her diabetes effectively. She has hyperlipidemia and was previously on atorvastatin  but has not taken it due to running out and not refilling the prescription. She has not had a recent eye exam, which is recommended annually due to her diabetes. She is on levothyroxine  for thyroid  management, and her thyroid  levels were normal at her last visit.  She smokes up to three cigarettes a day and wants to quit smoking.    Past Medical History:  Diagnosis Date   Diabetes mellitus without complication (HCC)    Hyperlipidemia associated with type 2 diabetes mellitus (HCC) 02/07/2023   Stroke (HCC)    Thyroid  disease     Past Surgical History:  Procedure Laterality Date   ABDOMINAL HYSTERECTOMY     CESAREAN SECTION     CHOLECYSTECTOMY      Family History  Problem Relation Age of Onset   Hearing loss Mother    Diabetes Father    Diabetes Maternal Grandmother    Diabetes Maternal Grandfather    Diabetes Paternal Grandmother    Diabetes Paternal Grandfather    Colon cancer Neg Hx    Stomach cancer Neg Hx    Esophageal varices Neg Hx     Social History   Socioeconomic History   Marital status: Married    Spouse name: Not on file   Number of children: 3   Years of education: Not on file   Highest education level: Not on file  Occupational History   Occupation: home maker  Tobacco Use   Smoking status: Every Day    Types: Cigarettes   Smokeless  tobacco: Never   Tobacco comments:    Smokes 2 cigs a day   Vaping Use   Vaping status: Never Used  Substance and Sexual Activity   Alcohol use: No   Drug use: No   Sexual activity: Yes  Other Topics Concern   Not on file  Social History Narrative   Not on file   Social Drivers of Health   Financial Resource Strain: Not on file  Food Insecurity: Not on file  Transportation Needs: Not on file  Physical Activity: Not on file  Stress: Not on file  Social Connections: Not on file    No Known Allergies  Outpatient Medications Prior to Visit  Medication Sig Dispense Refill   levothyroxine  (SYNTHROID ) 100 MCG tablet Take 1 tablet (100 mcg total) by mouth daily. 90 tablet 1   pantoprazole  (PROTONIX ) 40 MG tablet Take 1 tablet (40 mg total) by mouth daily. 90 tablet 3   pantoprazole  (PROTONIX ) 40 MG tablet Take 1 tablet (40 mg total) by mouth daily. (Patient not taking: Reported on 05/25/2024) 30 tablet 3   atorvastatin  (LIPITOR) 20 MG tablet Take 1 tablet (20 mg total) by mouth daily. (Patient not taking: Reported on 05/25/2024) 90 tablet 1   No facility-administered medications prior to visit.     ROS Review of Systems  Constitutional:  Negative  for activity change and appetite change.  HENT:  Negative for sinus pressure and sore throat.   Respiratory:  Negative for chest tightness, shortness of breath and wheezing.   Cardiovascular:  Negative for chest pain and palpitations.  Gastrointestinal:  Negative for abdominal distention, abdominal pain and constipation.  Genitourinary: Negative.   Musculoskeletal: Negative.   Psychiatric/Behavioral:  Negative for behavioral problems and dysphoric mood.     Objective:  BP 105/77 (BP Location: Left Arm, Patient Position: Sitting, Cuff Size: Normal)   Pulse 77   Wt 220 lb 3.2 oz (99.9 kg)   SpO2 96%   BMI 37.80 kg/m      05/25/2024   11:04 AM 04/22/2024   12:54 PM 04/22/2024   12:44 PM  BP/Weight  Systolic BP 105 74 118   Diastolic BP 77 44 73  Wt. (Lbs) 220.2    BMI 37.8 kg/m2        Physical Exam Constitutional:      Appearance: She is well-developed.  Cardiovascular:     Rate and Rhythm: Normal rate.     Heart sounds: Normal heart sounds. No murmur heard. Pulmonary:     Effort: Pulmonary effort is normal.     Breath sounds: Normal breath sounds. No wheezing or rales.  Chest:     Chest wall: No tenderness.  Abdominal:     General: Bowel sounds are normal. There is no distension.     Palpations: Abdomen is soft. There is no mass.     Tenderness: There is no abdominal tenderness.  Musculoskeletal:        General: Normal range of motion.     Right lower leg: No edema.     Left lower leg: No edema.  Neurological:     Mental Status: She is alert and oriented to person, place, and time.  Psychiatric:        Mood and Affect: Mood normal.        Latest Ref Rng & Units 11/25/2023   11:10 AM 06/13/2023   10:53 AM 11/08/2022    9:53 AM  CMP  Glucose 70 - 99 mg/dL 899  892  90   BUN 6 - 24 mg/dL 15  10  17    Creatinine 0.57 - 1.00 mg/dL 9.24  9.02  9.21   Sodium 134 - 144 mmol/L 137  139  136   Potassium 3.5 - 5.2 mmol/L 4.5  4.3  3.9   Chloride 96 - 106 mmol/L 101  100  101   CO2 20 - 29 mmol/L 23  25  23    Calcium  8.7 - 10.2 mg/dL 9.6  9.4  9.4   Total Protein 6.0 - 8.5 g/dL 7.5  7.0  7.6   Total Bilirubin 0.0 - 1.2 mg/dL 0.3  0.4  <9.7   Alkaline Phos 44 - 121 IU/L 129  97  127   AST 0 - 40 IU/L 16  19  19    ALT 0 - 32 IU/L 11  11  17      Lipid Panel     Component Value Date/Time   CHOL 194 11/25/2023 1110   TRIG 132 11/25/2023 1110   HDL 49 11/25/2023 1110   CHOLHDL 4.2 02/07/2023 1100   LDLCALC 121 (H) 11/25/2023 1110    CBC    Component Value Date/Time   WBC 10.3 04/22/2024 1329   RBC 4.68 04/22/2024 1329   HGB 10.9 (L) 04/22/2024 1329   HGB 12.5 06/13/2023 1053   HCT 33.8 (  L) 04/22/2024 1329   HCT 37.8 06/13/2023 1053   PLT 358.0 04/22/2024 1329   PLT 370  06/13/2023 1053   MCV 72.3 (L) 04/22/2024 1329   MCV 83 06/13/2023 1053   MCH 27.5 06/13/2023 1053   MCH 27.7 02/14/2014 1357   MCHC 32.3 04/22/2024 1329   RDW 16.2 (H) 04/22/2024 1329   RDW 15.5 (H) 06/13/2023 1053   LYMPHSABS 3.4 04/22/2024 1329   LYMPHSABS 3.8 (H) 06/13/2023 1053   MONOABS 0.7 04/22/2024 1329   EOSABS 0.1 04/22/2024 1329   EOSABS 0.5 (H) 06/13/2023 1053   BASOSABS 0.0 04/22/2024 1329   BASOSABS 0.1 06/13/2023 1053    Lab Results  Component Value Date   HGBA1C 6.1 05/25/2024    Lab Results  Component Value Date   HGBA1C 6.1 05/25/2024   HGBA1C 6.3 11/25/2023   HGBA1C 6.1 02/07/2023    Lab Results  Component Value Date   TSH 3.940 11/25/2023       Assessment & Plan Type II Diabetes Mellitus A1c improved from 6.3 to 6.1, indicating good glycemic control. -Currently on diet control - Continue current diabetes management plan. - Refer to an ophthalmologist for annual eye exam.  Hyperlipidemia associated with type 2 diabetes mellitus Cholesterol levels elevated due to non-adherence to atorvastatin . Resuming medication necessary before re-evaluation. - Instruct to resume atorvastatin  10 mg daily. - Recheck cholesterol levels at next visit.  Hypothyroidism Currently on levothyroxine  therapy.  Last TSH was normal Continue levothyroxine -  Tobacco Use Disorder Smokes up to three cigarettes a day and is interested in quitting. Prefers a medication that does not induce nausea or vomiting. Bupropion  prescribed to aid cessation. - Prescribe bupropion  for smoking cessation.   Meds ordered this encounter  Medications   atorvastatin  (LIPITOR) 20 MG tablet    Sig: Take 1 tablet (20 mg total) by mouth daily.    Dispense:  90 tablet    Refill:  1    Dose increase   buPROPion  (WELLBUTRIN  XL) 150 MG 24 hr tablet    Sig: Take 1 tablet (150 mg total) by mouth daily. For smoking cessation    Dispense:  90 tablet    Refill:  1    Follow-up: Return in  about 6 months (around 11/25/2024) for Chronic medical conditions.       Corrina Sabin, MD, FAAFP. [facility] [place], [place] [phone]   05/25/2024, 11:53 AM

## 2024-05-27 ENCOUNTER — Encounter: Payer: Self-pay | Admitting: Gastroenterology

## 2024-05-27 ENCOUNTER — Other Ambulatory Visit (INDEPENDENT_AMBULATORY_CARE_PROVIDER_SITE_OTHER): Payer: Self-pay

## 2024-05-27 ENCOUNTER — Ambulatory Visit: Payer: Self-pay | Admitting: Gastroenterology

## 2024-05-27 VITALS — BP 119/83 | HR 68 | Temp 97.3°F | Resp 22 | Ht 64.0 in | Wt 218.0 lb

## 2024-05-27 DIAGNOSIS — D500 Iron deficiency anemia secondary to blood loss (chronic): Secondary | ICD-10-CM

## 2024-05-27 DIAGNOSIS — K5900 Constipation, unspecified: Secondary | ICD-10-CM

## 2024-05-27 DIAGNOSIS — B9681 Helicobacter pylori [H. pylori] as the cause of diseases classified elsewhere: Secondary | ICD-10-CM

## 2024-05-27 DIAGNOSIS — R195 Other fecal abnormalities: Secondary | ICD-10-CM

## 2024-05-27 DIAGNOSIS — K3189 Other diseases of stomach and duodenum: Secondary | ICD-10-CM

## 2024-05-27 DIAGNOSIS — K2951 Unspecified chronic gastritis with bleeding: Secondary | ICD-10-CM

## 2024-05-27 LAB — CBC WITH DIFFERENTIAL/PLATELET
Basophils Absolute: 0.1 K/uL (ref 0.0–0.1)
Basophils Relative: 0.7 % (ref 0.0–3.0)
Eosinophils Absolute: 0.1 K/uL (ref 0.0–0.7)
Eosinophils Relative: 1.5 % (ref 0.0–5.0)
HCT: 32.7 % — ABNORMAL LOW (ref 36.0–46.0)
Hemoglobin: 10.5 g/dL — ABNORMAL LOW (ref 12.0–15.0)
Lymphocytes Relative: 37.3 % (ref 12.0–46.0)
Lymphs Abs: 2.9 K/uL (ref 0.7–4.0)
MCHC: 32.1 g/dL (ref 30.0–36.0)
MCV: 71.8 fl — ABNORMAL LOW (ref 78.0–100.0)
Monocytes Absolute: 0.5 K/uL (ref 0.1–1.0)
Monocytes Relative: 6.2 % (ref 3.0–12.0)
Neutro Abs: 4.3 K/uL (ref 1.4–7.7)
Neutrophils Relative %: 54.3 % (ref 43.0–77.0)
Platelets: 337 K/uL (ref 150.0–400.0)
RBC: 4.56 Mil/uL (ref 3.87–5.11)
RDW: 16.2 % — ABNORMAL HIGH (ref 11.5–15.5)
WBC: 7.8 K/uL (ref 4.0–10.5)

## 2024-05-27 LAB — IBC + FERRITIN
Ferritin: 5.9 ng/mL — ABNORMAL LOW (ref 10.0–291.0)
Iron: 60 ug/dL (ref 42–145)
Saturation Ratios: 12.8 % — ABNORMAL LOW (ref 20.0–50.0)
TIBC: 467.6 ug/dL — ABNORMAL HIGH (ref 250.0–450.0)
Transferrin: 334 mg/dL (ref 212.0–360.0)

## 2024-05-27 MED ORDER — SODIUM CHLORIDE 0.9 % IV SOLN: 500.0000 mL | INTRAVENOUS | Status: DC

## 2024-05-27 NOTE — Patient Instructions (Signed)
 Please read handouts provided. Continue present medications. Await pathology results. Resume previous diet.  YOU HAD AN ENDOSCOPIC PROCEDURE TODAY AT THE [facility]:   Refer to the procedure report that was given to you for any specific questions about what was found during the examination.  If the procedure report does not answer your questions, please call your gastroenterologist to clarify.  If you requested that your care partner not be given the details of your procedure findings, then the procedure report has been included in a sealed envelope for you to review at your convenience later.  YOU SHOULD EXPECT: Some feelings of bloating in the abdomen. Passage of more gas than usual.  Walking can help get rid of the air that was put into your GI tract during the procedure and reduce the bloating. If you had a lower endoscopy (such as a colonoscopy or flexible sigmoidoscopy) you may notice spotting of blood in your stool or on the toilet paper. If you underwent a bowel prep for your procedure, you may not have a normal bowel movement for a few days.  Please Note:  You might notice some irritation and congestion in your nose or some drainage.  This is from the oxygen used during your procedure.  There is no need for concern and it should clear up in a day or so.  SYMPTOMS TO REPORT IMMEDIATELY:USTED TUVO UN PROCEDIMIENTO ENDOSCPICO HOY EN EL [facility]:   Lea el informe del procedimiento que se le entreg para cualquier pregunta especfica sobre lo que se Dentist.  Si el informe del examen no responde a sus preguntas, por favor llame a su gastroenterlogo para aclararlo.  Si usted solicit que no se le den [employer] de lo que se Clinical cytogeneticist en su procedimiento al [employer] va a cuidar, entonces el informe del procedimiento se ha incluido en un sobre sellado para que usted lo revise despus cuando le sea ms conveniente.   LO QUE PUEDE ESPERAR: Algunas  sensaciones de hinchazn en el abdomen.  Puede tener ms gases de lo normal.  El caminar puede ayudarle a eliminar el aire que se le puso en el tracto gastrointestinal durante el procedimiento y reducir la hinchazn.  Si le hicieron una endoscopia inferior (como una colonoscopia o una sigmoidoscopia flexible), podra notar manchas de sangre en las heces fecales o en el papel higinico.  Si se someti a una preparacin intestinal para su procedimiento, es posible que no tenga una evacuacin intestinal normal durante [employer].   Tenga en cuenta:  Es posible que note un poco de irritacin y congestin en la nariz o algn drenaje.  Esto es debido al oxgeno [employer] durante su procedimiento.  No hay que preocuparse y esto debe desaparecer ms o Regulatory affairs officer.    Despus de la endoscopia superior (EGD)  Vmitos de Retail buyer o material como caf molido   Dolor en el pecho o dolor debajo de los omplatos que antes no tena   Dolor o dificultad persistente para tragar  Falta de aire que antes no tena   Fiebre de 100F o ms  Heces fecales negras y pegajosas   Para asuntos urgentes o de Associate Professor, puede comunicarse con un gastroenterlogo a cualquier hora llamando al [phone].  DIETA:  Recomendamos una comida pequea al principio, pero luego puede continuar con su dieta normal.  Tome muchos lquidos, pero debe evitar las bebidas alcohlicas durante 24 horas.    ACTIVIDAD:  Debe  planear tomarse las cosas con calma por el resto del da y no debe CONDUCIR ni usar maquinaria pesada Patent examiner (debido a los medicamentos de sedacin utilizados durante el examen).     SEGUIMIENTO: Nuestro personal llamar al nmero que aparece en su historial al siguiente da hbil de su procedimiento para ver cmo se siente y para responder cualquier pregunta o inquietud que pueda tener con respecto a la informacin que se le dio despus del procedimiento. Si no podemos contactarle, le dejaremos un mensaje.  Sin  embargo, si se siente bien y no tiene English as a second language teacher, no es necesario que nos devuelva la llamada.  Asumiremos que ha regresado a sus actividades diarias normales sin incidentes. Si se le tomaron algunas biopsias, le contactaremos por telfono o por carta en las prximas 3 semanas.  Si no ha sabido [employer] biopsias en el transcurso de 3 semanas, por favor llmenos al [phone].   FIRMAS/CONFIDENCIALIDAD: Usted y/o el acompaante que le cuide han firmado documentos que se ingresarn en su historial mdico electrnico.  Estas firmas atestiguan el hecho de que la informacin anterior   Following upper endoscopy (EGD)  Vomiting of blood or coffee ground material  New chest pain or pain under the shoulder blades  Painful or persistently difficult swallowing  New shortness of breath  Fever of 100F or higher  Black, tarry-looking stools  For urgent or emergent issues, a gastroenterologist can be reached at any hour by calling [phone]. Do not use MyChart messaging for urgent concerns.    DIET:  We do recommend a small meal at first, but then you may proceed to your regular diet.  Drink plenty of fluids but you should avoid alcoholic beverages for 24 hours.  ACTIVITY:  You should plan to take it easy for the rest of today and you should NOT DRIVE or use heavy machinery until tomorrow (because of the sedation medicines used during the test).    FOLLOW UP: Our staff will call the number listed on your records the next business day following your procedure.  We will call around 7:15- 8:00 am to check on you and address any questions or concerns that you may have regarding the information given to you following your procedure. If we do not reach you, we will leave a message.     If any biopsies were taken you will be contacted by phone or by letter within the next 1-3 weeks.  Please call us  at [phone] if you have not heard about the biopsies in 3 weeks.     SIGNATURES/CONFIDENTIALITY: You and/or your care partner have signed paperwork which will be entered into your electronic medical record.  These signatures attest to the fact that that the information above on your After Visit Summary has been reviewed and is understood.  Full responsibility of the confidentiality of this discharge information lies with you and/or your care-partner.

## 2024-05-27 NOTE — Progress Notes (Signed)
 History and Physical:  This patient presents for endoscopic testing for: Encounter Diagnoses  Name Primary?   Heme positive stool Yes   Iron deficiency anemia due to chronic blood loss     58 yo woman here for EGD to evaluate iron def anemia with no source on recent colonoscopy. She had an EGD 02/2023 with marked mucosal changes and neg for H pylori  Patient is otherwise without complaints or active issues today.   Past Medical History: Past Medical History:  Diagnosis Date   Diabetes mellitus without complication (HCC)    Hyperlipidemia associated with type 2 diabetes mellitus (HCC) 02/07/2023   Stroke (HCC)    Thyroid  disease      Past Surgical History: Past Surgical History:  Procedure Laterality Date   ABDOMINAL HYSTERECTOMY     CESAREAN SECTION     CHOLECYSTECTOMY     COLONOSCOPY     UPPER GASTROINTESTINAL ENDOSCOPY      Allergies: No Known Allergies  Outpatient Meds: Current Outpatient Medications  Medication Sig Dispense Refill   atorvastatin  (LIPITOR) 20 MG tablet Take 1 tablet (20 mg total) by mouth daily. 90 tablet 1   buPROPion  (WELLBUTRIN  XL) 150 MG 24 hr tablet Take 1 tablet (150 mg total) by mouth daily. For smoking cessation 90 tablet 1   levothyroxine  (SYNTHROID ) 100 MCG tablet Take 1 tablet (100 mcg total) by mouth daily. 90 tablet 1   pantoprazole  (PROTONIX ) 40 MG tablet Take 1 tablet (40 mg total) by mouth daily. 90 tablet 3   Current Facility-Administered Medications  Medication Dose Route Frequency Provider Last Rate Last Admin   0.9 %  sodium chloride  infusion  500 mL Intravenous Continuous Danis, Victory CROME III, MD          ___________________________________________________________________ Objective   Exam:  BP 103/68   Pulse 81   Temp (!) 97.3 F (36.3 C) (Temporal)   Ht 5' 4 (1.626 m)   Wt 218 lb (98.9 kg)   SpO2 97%   BMI 37.42 kg/m   CV: regular , S1/S2 Resp: clear to auscultation bilaterally, normal RR and effort noted GI:  soft, no tenderness, with active bowel sounds.   Assessment: Encounter Diagnoses  Name Primary?   Heme positive stool Yes   Iron deficiency anemia due to chronic blood loss      Plan: EGD  The benefits and risks of the planned procedure(s) were described in detail with the patient or (when appropriate) their health care proxy.  Risks were outlined as including, but not limited to, bleeding, infection, perforation, adverse medication reaction leading to cardiac or pulmonary decompensation, pancreatitis (if ERCP).  The limitation of incomplete mucosal visualization was also discussed.  No guarantees or warranties were given.  The patient is appropriate for an endoscopic procedure in the ambulatory setting.   - Victory Brand, MD

## 2024-05-27 NOTE — Progress Notes (Signed)
 Pt sedate, gd SR's, VSS, report to RN

## 2024-05-27 NOTE — Op Note (Signed)
 [facility] Patient Name: Bethany Vance Procedure Date: 05/27/2024 1:31 PM MRN: [id number] Endoscopist: Victory L. Legrand , MD, [id number] Age: 58 Referring MD:  Date of Birth: Oct 26, 1966 Gender: Female Account #: [id number] Procedure:                Upper GI endoscopy Indications:              Unexplained iron deficiency anemia, Heme positive                            stool Medicines:                Monitored Anesthesia Care Procedure:                Pre-Anesthesia Assessment:                           - Prior to the procedure, a History and Physical                            was performed, and patient medications and                            allergies were reviewed. The patient's tolerance of                            previous anesthesia was also reviewed. The risks                            and benefits of the procedure and the sedation                            options and risks were discussed with the patient.                            All questions were answered, and informed consent                            was obtained. Prior Anticoagulants: The patient has                            taken no anticoagulant or antiplatelet agents. ASA                            Grade Assessment: III - A patient with severe                            systemic disease. After reviewing the risks and                            benefits, the patient was deemed in satisfactory                            condition to undergo the procedure.  After obtaining informed consent, the endoscope was                            passed under direct vision. Throughout the                            procedure, the patient's blood pressure, pulse, and                            oxygen saturations were monitored continuously. The                            GIF [id number] #[id number] was introduced through the                            mouth, and advanced to the third part of  duodenum.                            The upper GI endoscopy was accomplished without                            difficulty. The patient tolerated the procedure                            fairly well. Scope In: Scope Out: Findings:                 The esophagus was normal.                           Patchy mucosal changes characterized by congestion                            and erythema were found in the gastric body.                            Several biopsies were obtained in the gastric body                            and in the gastric antrum with cold forceps for                            histology. Stomach distended well with insufflation.                           The cardia and gastric fundus were normal on                            retroflexion.                           The examined duodenum was normal. Complications:            No immediate complications. Estimated Blood Loss:     Estimated blood loss was minimal. Impression:               -  Normal esophagus.                           - Congested and erythematous mucosa in the gastric                            body.                           - Normal examined duodenum.                           - Several biopsies were obtained in the gastric                            body and in the gastric antrum.                           The gastric mucosal findings are of unclear cause,                            but they are considerably less prominent than what                            was seen on the May 2024 upper endoscopy and do not                            explain IDA. Recommendation:           - Patient has a contact number available for                            emergencies. The signs and symptoms of potential                            delayed complications were discussed with the                            patient. Return to normal activities tomorrow.                            Written discharge instructions were  provided to the                            patient.                           - Resume previous diet.                           - Continue present medications.                           - Await pathology results.                           - To visualize the small  bowel, perform video                            capsule endoscopy at appointment to be scheduled. Tomer Chalmers L. Legrand, MD 05/27/2024 1:56:25 PM This report has been signed electronically.

## 2024-05-27 NOTE — Progress Notes (Signed)
 Called to room to assist during endoscopic procedure.  Patient ID and intended procedure confirmed with present staff. Received instructions for my participation in the procedure from the performing physician.

## 2024-05-28 ENCOUNTER — Telehealth: Payer: Self-pay

## 2024-05-28 ENCOUNTER — Ambulatory Visit: Payer: Self-pay | Admitting: Physician Assistant

## 2024-05-28 DIAGNOSIS — D500 Iron deficiency anemia secondary to blood loss (chronic): Secondary | ICD-10-CM

## 2024-05-28 NOTE — Telephone Encounter (Signed)
 Post procedure follow up call, no answer

## 2024-06-01 LAB — SURGICAL PATHOLOGY

## 2024-06-02 ENCOUNTER — Ambulatory Visit: Payer: Self-pay | Admitting: Gastroenterology

## 2024-06-03 ENCOUNTER — Other Ambulatory Visit: Payer: Self-pay

## 2024-06-03 DIAGNOSIS — A048 Other specified bacterial intestinal infections: Secondary | ICD-10-CM

## 2024-06-03 MED ORDER — OMEPRAZOLE 20 MG PO CPDR
20.0000 mg | DELAYED_RELEASE_CAPSULE | Freq: Two times a day (BID) | ORAL | 0 refills | Status: AC
  Filled 2024-06-03: qty 28, 14d supply, fill #0

## 2024-06-03 MED ORDER — BISMUTH SUBSALICYLATE 262 MG PO TABS
2.0000 | ORAL_TABLET | Freq: Four times a day (QID) | ORAL | 0 refills | Status: AC
Start: 2024-06-03 — End: 2024-06-20
  Filled 2024-06-03: qty 120, 15d supply, fill #0

## 2024-06-03 MED ORDER — DOXYCYCLINE HYCLATE 100 MG PO TABS
100.0000 mg | ORAL_TABLET | Freq: Two times a day (BID) | ORAL | 0 refills | Status: AC
  Filled 2024-06-03: qty 28, 14d supply, fill #0

## 2024-06-03 MED ORDER — METRONIDAZOLE 250 MG PO TABS
250.0000 mg | ORAL_TABLET | Freq: Four times a day (QID) | ORAL | 0 refills | Status: AC
  Filled 2024-06-03: qty 56, 14d supply, fill #0

## 2024-06-05 ENCOUNTER — Other Ambulatory Visit: Payer: Self-pay

## 2024-06-16 ENCOUNTER — Other Ambulatory Visit: Payer: Self-pay

## 2024-06-16 ENCOUNTER — Other Ambulatory Visit: Payer: Self-pay | Admitting: Family Medicine

## 2024-06-16 ENCOUNTER — Other Ambulatory Visit: Payer: Self-pay | Admitting: Gastroenterology

## 2024-06-16 MED ORDER — LEVOTHYROXINE SODIUM 100 MCG PO TABS
100.0000 ug | ORAL_TABLET | Freq: Every day | ORAL | 1 refills | Status: AC
  Filled 2024-06-16: qty 90, 90d supply, fill #0
  Filled 2024-09-21: qty 90, 90d supply, fill #1

## 2024-06-17 ENCOUNTER — Ambulatory Visit: Payer: Self-pay | Admitting: Gastroenterology

## 2024-06-17 ENCOUNTER — Telehealth: Payer: Self-pay

## 2024-06-17 ENCOUNTER — Other Ambulatory Visit: Payer: Self-pay

## 2024-06-17 NOTE — Telephone Encounter (Signed)
 Pt came to office for scheduled capsule endoscopy. Spanish Interpreter present. Pt stated that she did not prep. Pt stated that she was unaware of any prep instructions. Chart was reviewed.  Pt was notified of the prep instructions that was sent to pt via my chart. Pt was rescheduled for 06/25/2024. Prep instructions were printed for pt and times and dates updated in front of pt with spanish interpreter.  Pt verbalized understanding with all questions answered.

## 2024-06-17 NOTE — Patient Instructions (Signed)
POST CAPSULE INSTRUCTIONS:  Contact our office immediately at [phone] if you suffer from any abdominal pain, nausea, or vomiting during capsule endoscopy. Do not eat or drink for at least 2 hours. After 2 hours you may have any of the following to drink: Water   White grape juice 7-Up   Chicken Bouillon Sprite   Ginger Ale After 4 hours you may have a light snack to include any of the following: A cup of soup   sandwich Bowl of cereal  Rice Toast   Eggs 2-3 small cookies (i.e. vanilla wafers or graham crackers) After 8 hours you may return to your regular diet. During your procedure do not go near anyone else that is having capsule endoscopy. Do not be in close contact with an MRI machine or a radio or television tower. Do not wear a heavy coat or sweater because your recorder may over heat and stop recording.   Do not disconnect the equipment or remove the belt at any time.  Since the Data Recorder is actually a small computer, it should be treated with utmost care and protection.  Avoid sudden movement and banging of the Data Recorder.  Do not do any heavy lifting or strenuous physical activity during the test especially if it involves sweating and do not bend over or stoop during capsule endoscopy. During capsule endoscopy, you will need to verify every 15 minutes that the small light on top of the Data Recorder is blinking twice per second.  If for some reason it stops blinking at this site, record the time and contact our office at [phone].  

## 2024-06-18 ENCOUNTER — Other Ambulatory Visit: Payer: Self-pay

## 2024-06-25 ENCOUNTER — Other Ambulatory Visit (INDEPENDENT_AMBULATORY_CARE_PROVIDER_SITE_OTHER): Payer: Self-pay

## 2024-06-25 ENCOUNTER — Ambulatory Visit: Payer: Self-pay | Admitting: Gastroenterology

## 2024-06-25 DIAGNOSIS — D500 Iron deficiency anemia secondary to blood loss (chronic): Secondary | ICD-10-CM

## 2024-06-25 DIAGNOSIS — K31819 Angiodysplasia of stomach and duodenum without bleeding: Secondary | ICD-10-CM

## 2024-06-25 LAB — CBC WITH DIFFERENTIAL/PLATELET
Basophils Absolute: 0.1 K/uL (ref 0.0–0.1)
Basophils Relative: 0.7 % (ref 0.0–3.0)
Eosinophils Absolute: 0.1 K/uL (ref 0.0–0.7)
Eosinophils Relative: 1.4 % (ref 0.0–5.0)
HCT: 34.8 % — ABNORMAL LOW (ref 36.0–46.0)
Hemoglobin: 10.9 g/dL — ABNORMAL LOW (ref 12.0–15.0)
Lymphocytes Relative: 43.6 % (ref 12.0–46.0)
Lymphs Abs: 3.4 K/uL (ref 0.7–4.0)
MCHC: 31.4 g/dL (ref 30.0–36.0)
MCV: 72.5 fl — ABNORMAL LOW (ref 78.0–100.0)
Monocytes Absolute: 0.4 K/uL (ref 0.1–1.0)
Monocytes Relative: 5.7 % (ref 3.0–12.0)
Neutro Abs: 3.8 K/uL (ref 1.4–7.7)
Neutrophils Relative %: 48.6 % (ref 43.0–77.0)
Platelets: 346 K/uL (ref 150.0–400.0)
RBC: 4.8 Mil/uL (ref 3.87–5.11)
RDW: 17.3 % — ABNORMAL HIGH (ref 11.5–15.5)
WBC: 7.9 K/uL (ref 4.0–10.5)

## 2024-06-25 LAB — IBC + FERRITIN
Ferritin: 7.8 ng/mL — ABNORMAL LOW (ref 10.0–291.0)
Iron: 60 ug/dL (ref 42–145)
Saturation Ratios: 11.9 % — ABNORMAL LOW (ref 20.0–50.0)
TIBC: 505.4 ug/dL — ABNORMAL HIGH (ref 250.0–450.0)
Transferrin: 361 mg/dL — ABNORMAL HIGH (ref 212.0–360.0)

## 2024-06-25 NOTE — Progress Notes (Signed)
 CAPSULE ID: [id number] Exp: 2025-01-20 LOT: [id number]  Patient arrived for capsule endoscopy. Spanish interpret accompanying patient.  Reported the prep went well. Confirmed patient is fasting. Explained dietary restrictions for the next few hours. Patient verbalized understanding. Opened capsule, ensured capsule was flashing and transmitting to the recorder prior to the patient swallowing the capsule. Patient swallowed capsule without difficulty. Patient told to call the office with any questions. Understands to return to the office today between 4:00 and 4:30 pm. No further questions by the conclusion of the visit.

## 2024-06-25 NOTE — Patient Instructions (Signed)
 INSTRUCCIONES POST CPSULA:  ? Comunquese con nuestra oficina de inmediato al [phone] si sufre algn dolor abdominal, nuseas o vmitos durante la cpsula endoscpica. ? No coma ni beba durante al menos 2 horas. ? Despus de 2 horas, puede beber cualquiera de los siguientes: Agua                           Jugo de uva blanca 7-Up                            Caldo de pollo Sprite                           Ginger Ale  ? Despus de 4 horas, puede tomar un refrigerio ligero que incluya cualquiera de los siguientes: Una taza de sopa               sndwich Tazn de cereal             Arroz Tostadas                           Huevos 2-3 galletas pequeas (es decir, galletas de vainilla o galletas integrales)  ? Regrese a esta oficina a las 4:00 pm. Despus de esto podr volver a su dieta normal.  Durante su procedimiento, no se acerque a ninguna otra persona a la que le estn realizando una cpsula endoscpica.  No est en contacto cercano con una mquina de resonancia magntica o una torre de radio o televisin.  No use un abrigo o suter grueso porque su grabadora podra sobrecalentarse y dejar de Statistician.  No desconectes el equipo ni retires la correa en ningn momento.Dado que el [place] de datos es en realidad una computadora pequea, debe tratarse con sumo cuidado y proteccin.Evite movimientos repentinos y [address] del Interior and spatial designer de [place].  No levante objetos pesados ni haga actividad fsica extenuante durante la prueba, especialmente si implica sudar.  No se agache ni se agache durante la cpsula endoscpica.  Durante la cpsula endoscpica, deber verificar cada 15 minutos que la pequea luz en la parte superior del Interior and spatial designer de datos parpadee dos veces por segundo.Si por alguna razn deja de parpadear, registre la hora y comunquese con nuestra oficina al [phone].

## 2024-06-26 ENCOUNTER — Ambulatory Visit: Payer: Self-pay | Admitting: Gastroenterology

## 2024-07-01 ENCOUNTER — Telehealth: Payer: Self-pay

## 2024-07-01 NOTE — Telephone Encounter (Signed)
 [employer] has been left at the front desk for pick up  Left message on machine to call back

## 2024-07-01 NOTE — Telephone Encounter (Signed)
 Attempted to reach the pt by phone using interpreter [place], no answer voice mail left

## 2024-07-01 NOTE — Telephone Encounter (Signed)
-----   Message from Nurse Orvan Papadakis P sent at 06/03/2024 11:18 AM EDT ----- Pt needs diatherix

## 2024-07-02 NOTE — Telephone Encounter (Signed)
 Unable to reach pt by phone using interpreter- will mail a letter

## 2024-07-07 ENCOUNTER — Telehealth: Payer: Self-pay | Admitting: Gastroenterology

## 2024-07-07 DIAGNOSIS — D500 Iron deficiency anemia secondary to blood loss (chronic): Secondary | ICD-10-CM

## 2024-07-07 NOTE — Telephone Encounter (Signed)
 Lab orders have been entered   Left message on daughters machine to call back

## 2024-07-07 NOTE — Telephone Encounter (Signed)
 Spanish-speaking patient-probably best to speak with her English-speaking daughter who I have met when she accompanies her mother for procedures.  Video capsule study did not show any active bleeding.  Rapid transit of the capsule through the small bowel.  A few tiny small intestine AVMs which are a common cause of microscopic blood loss and anemia.  Those that were seen are beyond the reach of scopes.  I suspect she also may have some impairment of iron absorption playing a role here as well.  So she needs to stay on long-term iron supplement with periodic checks of her blood counts and iron levels.  We will initially do that and then turned that over to her primary care provider.  We need to check her CBC and iron studies in about 6 weeks (they were last done on 06/25/2024, at which time Harlene recommended increasing the iron to twice daily).  If her hemoglobin and iron levels do not improve sufficiently on oral iron, then IV iron might be more beneficial.  However, I also understand that she is without insurance coverage, so we are trying to contain costs as best we can.  VEAR Brand MD

## 2024-07-08 NOTE — Telephone Encounter (Signed)
 The pt daughter has been contacted and aware of the results.  She will have the pt come in for repeat labs in 6 weeks. Orders have been entered

## 2024-08-04 ENCOUNTER — Encounter: Payer: Self-pay | Admitting: Gastroenterology

## 2024-08-26 ENCOUNTER — Other Ambulatory Visit (INDEPENDENT_AMBULATORY_CARE_PROVIDER_SITE_OTHER): Payer: Self-pay

## 2024-08-26 ENCOUNTER — Ambulatory Visit: Payer: Self-pay | Admitting: Gastroenterology

## 2024-08-26 DIAGNOSIS — D500 Iron deficiency anemia secondary to blood loss (chronic): Secondary | ICD-10-CM

## 2024-08-26 LAB — IBC + FERRITIN
Ferritin: 4.9 ng/mL — ABNORMAL LOW (ref 10.0–291.0)
Iron: 18 ug/dL — ABNORMAL LOW (ref 42–145)
Saturation Ratios: 4 % — ABNORMAL LOW (ref 20.0–50.0)
TIBC: 452.2 ug/dL — ABNORMAL HIGH (ref 250.0–450.0)
Transferrin: 323 mg/dL (ref 212.0–360.0)

## 2024-08-26 LAB — CBC WITH DIFFERENTIAL/PLATELET
Basophils Absolute: 0.1 K/uL (ref 0.0–0.1)
Basophils Relative: 0.6 % (ref 0.0–3.0)
Eosinophils Absolute: 0.3 K/uL (ref 0.0–0.7)
Eosinophils Relative: 2.9 % (ref 0.0–5.0)
HCT: 33 % — ABNORMAL LOW (ref 36.0–46.0)
Hemoglobin: 10.7 g/dL — ABNORMAL LOW (ref 12.0–15.0)
Lymphocytes Relative: 37.7 % (ref 12.0–46.0)
Lymphs Abs: 3.4 K/uL (ref 0.7–4.0)
MCHC: 32.4 g/dL (ref 30.0–36.0)
MCV: 72.5 fl — ABNORMAL LOW (ref 78.0–100.0)
Monocytes Absolute: 0.7 K/uL (ref 0.1–1.0)
Monocytes Relative: 7.7 % (ref 3.0–12.0)
Neutro Abs: 4.6 K/uL (ref 1.4–7.7)
Neutrophils Relative %: 51.1 % (ref 43.0–77.0)
Platelets: 358 K/uL (ref 150.0–400.0)
RBC: 4.56 Mil/uL (ref 3.87–5.11)
RDW: 16.7 % — ABNORMAL HIGH (ref 11.5–15.5)
WBC: 9 K/uL (ref 4.0–10.5)

## 2024-08-27 ENCOUNTER — Telehealth: Payer: Self-pay

## 2024-08-27 DIAGNOSIS — D500 Iron deficiency anemia secondary to blood loss (chronic): Secondary | ICD-10-CM | POA: Insufficient documentation

## 2024-08-27 NOTE — Telephone Encounter (Signed)
 Dr Legrand see the response from [place] below regarding IV Iron   She will PAP (free drug) I will let [facility] know to reach out to her for enrollment   You can enter order for your choice of Iron

## 2024-08-27 NOTE — Addendum Note (Signed)
 Addended by: LEGRAND VICTORY CROME on: 08/27/2024 03:36 PM   Modules accepted: Orders

## 2024-08-28 ENCOUNTER — Encounter: Payer: Self-pay | Admitting: Gastroenterology

## 2024-08-28 NOTE — Addendum Note (Signed)
 Addended by: LEGRAND VICTORY CROME on: 08/28/2024 11:53 AM   Modules accepted: Orders

## 2024-08-28 NOTE — Telephone Encounter (Signed)
 Dr Legrand I received a message from [facility] this morning with this information.  Good Morning. Lets try Monoferric due to its' only x1 dose vs the other irons are two or more

## 2024-08-28 NOTE — Telephone Encounter (Signed)
 Yes, sorry. I just got that message this morning.

## 2024-08-28 NOTE — Telephone Encounter (Signed)
 New order set placed for monoferric.  I do not know how to cancel or delete the order set for the venofer, so please do so.  Thank you   - Victory Brand, MD    [facility]

## 2024-08-28 NOTE — Telephone Encounter (Signed)
 OK, do I need to place new orders then?   - HD

## 2024-09-18 ENCOUNTER — Other Ambulatory Visit (HOSPITAL_COMMUNITY): Payer: Self-pay | Admitting: Gastroenterology

## 2024-09-18 ENCOUNTER — Telehealth (HOSPITAL_COMMUNITY): Payer: Self-pay

## 2024-09-18 NOTE — Telephone Encounter (Signed)
 PAP FREE DRUG Site of care: Site of care: [facility] Payer: None Medication & CPT/J Code(s) submitted: Monoferric [facility] derisomaltose) [phone] Diagnosis Code: D50.0 Auth type: FREE DRUG Units/visits requested: 1000mg  x 1 dose Reference number: [id number]  Approval from: 08/28/24 to 08/28/25

## 2024-09-21 ENCOUNTER — Other Ambulatory Visit: Payer: Self-pay

## 2024-09-21 ENCOUNTER — Encounter (HOSPITAL_COMMUNITY): Payer: Self-pay | Admitting: Gastroenterology

## 2024-10-20 ENCOUNTER — Telehealth (HOSPITAL_COMMUNITY): Payer: Self-pay | Admitting: Pharmacist

## 2024-10-20 NOTE — Telephone Encounter (Signed)
 Patient's Monoferric was received from pt assistance program on 09/15/2024.  Patient was contacted for scheduling but did not respond to outbound calls.  Sherry Pennant, PharmD, MPH, BCPS, CPP Clinical Pharmacist

## 2024-11-24 ENCOUNTER — Telehealth: Payer: Self-pay | Admitting: Family Medicine

## 2024-11-24 NOTE — Telephone Encounter (Signed)
 pT confirmed appt

## 2024-11-25 ENCOUNTER — Ambulatory Visit: Payer: Self-pay | Admitting: Family Medicine

## 2025-01-12 ENCOUNTER — Ambulatory Visit: Payer: Self-pay | Admitting: Family Medicine
